# Patient Record
Sex: Male | Born: 1940
Health system: Southern US, Community
[De-identification: ages and names within clinical notes are randomized; demographics above are authoritative.]

## PROBLEM LIST (undated history)

## (undated) DIAGNOSIS — M545 Low back pain, unspecified: Secondary | ICD-10-CM

## (undated) DIAGNOSIS — D219 Benign neoplasm of connective and other soft tissue, unspecified: Secondary | ICD-10-CM

## (undated) DIAGNOSIS — M720 Palmar fascial fibromatosis [Dupuytren]: Secondary | ICD-10-CM

## (undated) DIAGNOSIS — R251 Tremor, unspecified: Secondary | ICD-10-CM

## (undated) DIAGNOSIS — I10 Essential (primary) hypertension: Secondary | ICD-10-CM

## (undated) DIAGNOSIS — E785 Hyperlipidemia, unspecified: Secondary | ICD-10-CM

## (undated) DIAGNOSIS — I219 Acute myocardial infarction, unspecified: Secondary | ICD-10-CM

## (undated) DIAGNOSIS — I491 Atrial premature depolarization: Secondary | ICD-10-CM

## (undated) DIAGNOSIS — K219 Gastro-esophageal reflux disease without esophagitis: Secondary | ICD-10-CM

## (undated) DIAGNOSIS — I452 Bifascicular block: Secondary | ICD-10-CM

## (undated) DIAGNOSIS — I2510 Atherosclerotic heart disease of native coronary artery without angina pectoris: Secondary | ICD-10-CM

## (undated) DIAGNOSIS — Z955 Presence of coronary angioplasty implant and graft: Secondary | ICD-10-CM

## (undated) DIAGNOSIS — M1990 Unspecified osteoarthritis, unspecified site: Secondary | ICD-10-CM

## (undated) HISTORY — DX: Low back pain: M54.5

## (undated) HISTORY — DX: Low back pain, unspecified: M54.50

## (undated) HISTORY — DX: Benign neoplasm of connective and other soft tissue, unspecified: D21.9

## (undated) HISTORY — DX: Hyperlipidemia, unspecified: E78.5

## (undated) HISTORY — DX: Acute myocardial infarction, unspecified: I21.9

## (undated) HISTORY — PX: CORONARY STENT PLACEMENT: SHX1402

## (undated) HISTORY — DX: Presence of coronary angioplasty implant and graft: Z95.5

## (undated) HISTORY — PX: OTHER SURGICAL HISTORY: SHX169

---

## 1997-08-10 ENCOUNTER — Other Ambulatory Visit: Admission: RE | Admit: 1997-08-10 | Discharge: 1997-08-10 | Payer: Self-pay | Admitting: *Deleted

## 1997-08-30 ENCOUNTER — Other Ambulatory Visit: Admission: RE | Admit: 1997-08-30 | Discharge: 1997-08-30 | Payer: Self-pay | Admitting: *Deleted

## 1998-12-10 ENCOUNTER — Encounter: Payer: Self-pay | Admitting: Orthopedic Surgery

## 1998-12-10 ENCOUNTER — Ambulatory Visit (HOSPITAL_COMMUNITY): Admission: RE | Admit: 1998-12-10 | Discharge: 1998-12-10 | Payer: Self-pay | Admitting: Orthopedic Surgery

## 2001-12-15 ENCOUNTER — Ambulatory Visit (HOSPITAL_COMMUNITY): Admission: RE | Admit: 2001-12-15 | Discharge: 2001-12-15 | Payer: Self-pay | Admitting: Gastroenterology

## 2003-11-16 ENCOUNTER — Ambulatory Visit (HOSPITAL_COMMUNITY): Admission: RE | Admit: 2003-11-16 | Discharge: 2003-11-16 | Payer: Self-pay | Admitting: Orthopedic Surgery

## 2003-11-16 ENCOUNTER — Encounter (INDEPENDENT_AMBULATORY_CARE_PROVIDER_SITE_OTHER): Payer: Self-pay | Admitting: *Deleted

## 2003-11-16 ENCOUNTER — Ambulatory Visit (HOSPITAL_BASED_OUTPATIENT_CLINIC_OR_DEPARTMENT_OTHER): Admission: RE | Admit: 2003-11-16 | Discharge: 2003-11-16 | Payer: Self-pay | Admitting: Orthopedic Surgery

## 2005-03-31 ENCOUNTER — Emergency Department (HOSPITAL_COMMUNITY): Admission: EM | Admit: 2005-03-31 | Discharge: 2005-03-31 | Payer: Self-pay | Admitting: Family Medicine

## 2006-01-27 ENCOUNTER — Emergency Department (HOSPITAL_COMMUNITY): Admission: EM | Admit: 2006-01-27 | Discharge: 2006-01-27 | Payer: Self-pay | Admitting: Family Medicine

## 2007-02-11 DIAGNOSIS — I219 Acute myocardial infarction, unspecified: Secondary | ICD-10-CM

## 2007-02-11 HISTORY — DX: Acute myocardial infarction, unspecified: I21.9

## 2007-02-11 HISTORY — PX: CORONARY ANGIOPLASTY: SHX604

## 2007-11-06 ENCOUNTER — Inpatient Hospital Stay (HOSPITAL_COMMUNITY): Admission: EM | Admit: 2007-11-06 | Discharge: 2007-11-10 | Payer: Self-pay | Admitting: Emergency Medicine

## 2007-12-01 ENCOUNTER — Inpatient Hospital Stay (HOSPITAL_COMMUNITY): Admission: EM | Admit: 2007-12-01 | Discharge: 2007-12-04 | Payer: Self-pay | Admitting: Emergency Medicine

## 2007-12-01 HISTORY — PX: CARDIAC CATHETERIZATION: SHX172

## 2007-12-16 ENCOUNTER — Encounter (HOSPITAL_COMMUNITY): Admission: RE | Admit: 2007-12-16 | Discharge: 2008-02-09 | Payer: Self-pay | Admitting: Cardiology

## 2007-12-24 ENCOUNTER — Ambulatory Visit: Payer: Self-pay | Admitting: *Deleted

## 2007-12-25 ENCOUNTER — Inpatient Hospital Stay (HOSPITAL_COMMUNITY): Admission: EM | Admit: 2007-12-25 | Discharge: 2007-12-27 | Payer: Self-pay | Admitting: Emergency Medicine

## 2008-02-11 ENCOUNTER — Encounter (HOSPITAL_COMMUNITY): Admission: RE | Admit: 2008-02-11 | Discharge: 2008-05-09 | Payer: Self-pay | Admitting: Cardiology

## 2008-03-16 ENCOUNTER — Inpatient Hospital Stay (HOSPITAL_COMMUNITY): Admission: AD | Admit: 2008-03-16 | Discharge: 2008-03-17 | Payer: Self-pay | Admitting: Cardiology

## 2008-05-10 ENCOUNTER — Encounter (HOSPITAL_COMMUNITY): Admission: RE | Admit: 2008-05-10 | Discharge: 2008-08-08 | Payer: Self-pay | Admitting: Cardiology

## 2008-08-17 ENCOUNTER — Inpatient Hospital Stay (HOSPITAL_COMMUNITY): Admission: EM | Admit: 2008-08-17 | Discharge: 2008-08-18 | Payer: Self-pay | Admitting: Emergency Medicine

## 2009-02-08 ENCOUNTER — Ambulatory Visit (HOSPITAL_BASED_OUTPATIENT_CLINIC_OR_DEPARTMENT_OTHER): Admission: RE | Admit: 2009-02-08 | Discharge: 2009-02-08 | Payer: Self-pay | Admitting: Orthopedic Surgery

## 2009-02-10 HISTORY — PX: CARDIOVASCULAR STRESS TEST: SHX262

## 2010-04-18 ENCOUNTER — Encounter (INDEPENDENT_AMBULATORY_CARE_PROVIDER_SITE_OTHER): Payer: Self-pay | Admitting: *Deleted

## 2010-04-23 NOTE — Letter (Signed)
Summary: Appointment - Reschedule  [facility], Main Office  [address]   [place], [place] [address]   Phone: [phone]  Fax: [phone]     April 18, 2010 MRN: [id number]   Sonya Oki [address] [place], [place]  [address]   Dear Mr. Grigoryan,   Due to a change in our office schedule, your appointment on  March 29,2012 at 10:00 must be changed.  It is very important that we reach you to reschedule this appointment. We look forward to participating in your health care needs. Please contact us at the number listed above at your earliest convenience to reschedule this appointment.     Sincerely, Pension scheme manager

## 2010-04-27 ENCOUNTER — Encounter: Payer: Self-pay | Admitting: Internal Medicine

## 2010-05-09 ENCOUNTER — Ambulatory Visit: Payer: Self-pay | Admitting: Internal Medicine

## 2010-05-13 LAB — BASIC METABOLIC PANEL
CO2: 28 mEq/L (ref 19–32)
Calcium: 9.1 mg/dL (ref 8.4–10.5)
Chloride: 101 mEq/L (ref 96–112)
Creatinine, Ser: 0.88 mg/dL (ref 0.4–1.5)
GFR calc Af Amer: >60 mL/min (ref >60)
Glucose, Bld: 90 mg/dL (ref 70–99)

## 2010-05-19 LAB — CARDIAC PANEL(CRET KIN+CKTOT+MB+TROPI)
CK, MB: 1.7 ng/mL (ref 0.3–4.0)
CK, MB: 1.9 ng/mL (ref 0.3–4.0)
Relative Index: INVALID (ref 0.0–2.5)
Total CK: 65 U/L (ref 7–232)
Troponin I: 0.02 ng/mL (ref 0.00–0.06)

## 2010-05-19 LAB — CBC
HCT: 41.8 % (ref 39.0–52.0)
MCHC: 34.5 g/dL (ref 30.0–36.0)
MCV: 96 fL (ref 78.0–100.0)
Platelets: 171 10*3/uL (ref 150–400)
RBC: 4.35 MIL/uL (ref 4.22–5.81)
WBC: 5.8 10*3/uL (ref 4.0–10.5)

## 2010-05-19 LAB — PROTIME-INR
INR: 1 (ref 0.00–1.49)
Prothrombin Time: 13.7 seconds (ref 11.6–15.2)

## 2010-05-19 LAB — COMPREHENSIVE METABOLIC PANEL
BUN: 14 mg/dL (ref 6–23)
CO2: 24 mEq/L (ref 19–32)
Calcium: 9.5 mg/dL (ref 8.4–10.5)
Chloride: 99 mEq/L (ref 96–112)
Creatinine, Ser: 0.9 mg/dL (ref 0.4–1.5)
GFR calc Af Amer: >60 mL/min (ref >60)
GFR calc non Af Amer: >60 mL/min (ref >60)
Total Bilirubin: 0.9 mg/dL (ref 0.3–1.2)

## 2010-05-19 LAB — DIFFERENTIAL
Basophils Absolute: 0 10*3/uL (ref 0.0–0.1)
Lymphocytes Relative: 17 % (ref 12–46)
Lymphs Abs: 1 10*3/uL (ref 0.7–4.0)
Neutro Abs: 4.3 10*3/uL (ref 1.7–7.7)

## 2010-05-19 LAB — POCT CARDIAC MARKERS: Troponin i, poc: <0.05 ng/mL (ref 0.00–0.09)

## 2010-05-19 LAB — CK TOTAL AND CKMB (NOT AT ARMC)
CK, MB: 2.3 ng/mL (ref 0.3–4.0)
Relative Index: INVALID (ref 0.0–2.5)
Total CK: 86 U/L (ref 7–232)

## 2010-05-28 LAB — CBC
HCT: 39.6 % (ref 39.0–52.0)
MCHC: 34.8 g/dL (ref 30.0–36.0)
MCV: 94.3 fL (ref 78.0–100.0)
Platelets: 173 10*3/uL (ref 150–400)
WBC: 5.9 10*3/uL (ref 4.0–10.5)

## 2010-05-28 LAB — BASIC METABOLIC PANEL
BUN: 13 mg/dL (ref 6–23)
CO2: 27 mEq/L (ref 19–32)
Chloride: 102 mEq/L (ref 96–112)
Potassium: 4.2 mEq/L (ref 3.5–5.1)

## 2010-06-01 IMAGING — CR DG CHEST 1V PORT
1 series · 1 of 1 positions shown · non-contrast
Comparison: 01/27/2006

CLINICAL DATA: Chest pain

CHEST - 1 VIEW portable

[AP]
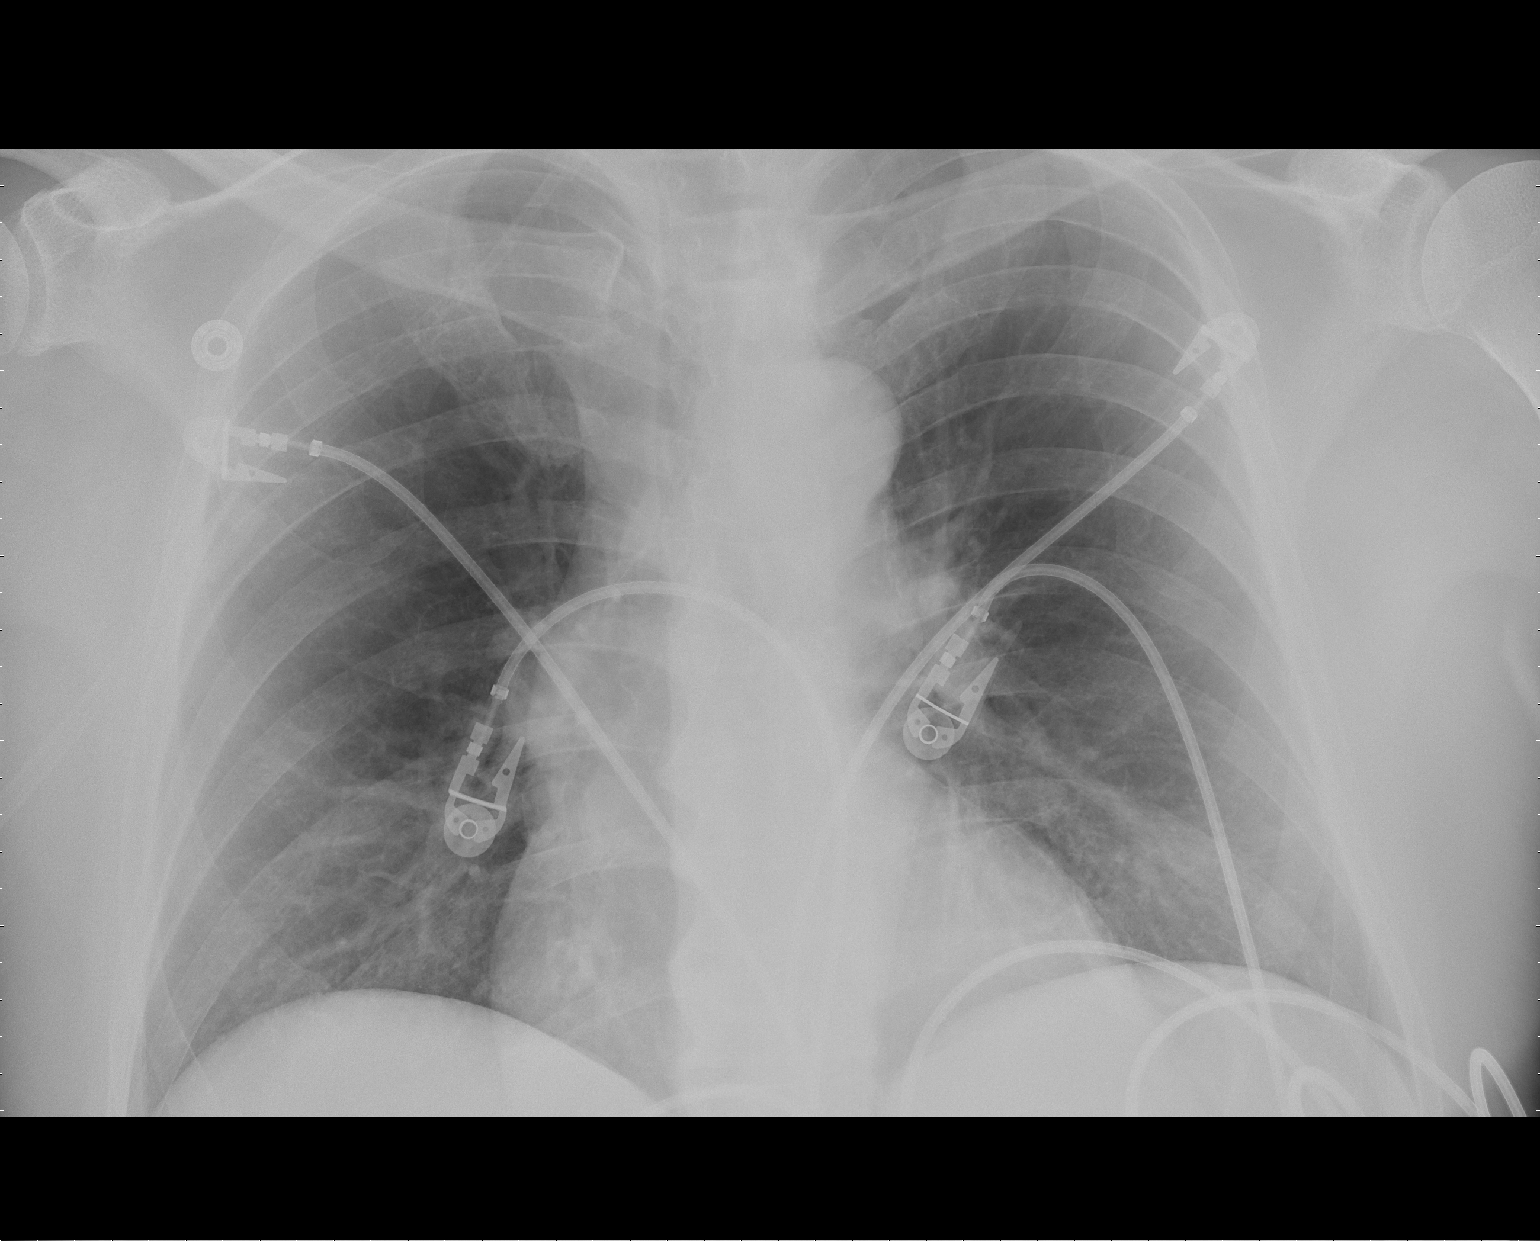

[1 of 1 positions shown; findings below may reference images not displayed]

FINDINGS: The heart size and mediastinal contours are within normal
limits.  Both lungs are clear.  The visualized skeletal structures
are unremarkable.
IMPRESSION: No active cardiopulmonary disease.

## 2010-06-25 NOTE — H&P (Signed)
NAME:  Zachary Barron, RABALAIS NO.:  [id number]   MEDICAL RECORD NO.:  [id number]          PATIENT TYPE:  INP   LOCATION:  2904                         FACILITY:  [facility]   PHYSICIAN:  Francisca December, M.D.  DATE OF BIRTH:  Jun 04, 1940   DATE OF ADMISSION:  11/06/2007  DATE OF DISCHARGE:                              HISTORY & PHYSICAL   CHIEF COMPLAINT:  Chest pain.   Zachary Barron is 70 year old male patient who complains of chest pain on  and off since Monday for this past week.  Apparently, he had picked up  his granddaughter, and he thought that this was related to  musculoskeletal pain.  He then began having chest pain that started last  night about 8 p.m.  The pain waxed and waned overnight, but never was  totally relieved.  He then went to [facility], and  EKG was concerning for acute myocardial infarction.  The patient had  minimal ST-segment elevation in the lateral leads, right bundle-branch  block, and rate 60 beats per minute.   In [facility] Room, he did rule in with troponins of 2.91.  He  was then brought to the cardiac catheterization lab emergently.   PAST MEDICAL HISTORY:  He has a history of pneumonia.  He has reflux.  He is otherwise healthy.  There is a family history of coronary artery  disease, 3 out of 6 brothers has had coronary artery disease.   SOCIAL HISTORY:  He lives in [place] with his wife.  His son is a  Armed forces operational officer.  The patient denies any tobacco, alcohol, or illicit drug  use.   FAMILY HISTORY:  Mother died in her 37s of hypertension and Parkinson  disease.  His father died at age 75 of old age.   ALLERGIES:  No known drug allergies.   MEDICATIONS:  He tells Dr. Amil Amen he is on Protonix 40 mg a day.   REVIEW OF SYSTEMS:  As above and otherwise negative.   PHYSICAL EXAMINATION:  VITAL SIGNS:  Temp 97.5, pulse is 72,  respirations 22, blood pressure 185/102, and O2 saturations 99% on room  air.  GENERAL:  He is anxious, but appears to be in no acute distress.  HEENT:  Grossly normal.  Sclerae clear.  Conjunctivae normal.  Nares  without drainage.  NECK:  No carotid or subclavian bruits.  No JVD or thyromegaly.  CHEST:  Clear to auscultation bilaterally.  No wheezing or rhonchi.  HEART:  Regular rate and rhythm.  No gross murmur.  ABDOMEN:  Good bowel sounds, nontender, and nondistended.  No masses, no  bruits.  LOWER EXTREMITIES:  No peripheral edema.  No femoral bruit.  SKIN:  Warm and dry.  NEUROLOGIC:  Cranial nerves II through XII grossly intact.  PSYCHIATRY:  Normal mood and affect, but with anxiety.   Chest x-ray:  N/A.   LABORATORY DATA:  Hemoglobin 16.3 and hematocrit 48.  Sodium 137,  potassium 4.5, BUN 13, creatinine 1.1, and glucose 111.  Point-of-care  markers, myoglobin 47.3/76.9, troponin 2.91.  EKG as stated above.   ASSESSMENT  AND PLAN:  1. Anterolateral wall myocardial infarction.  2. Right bundle-branch block.  3. Family history of coronary artery disease.  4. Gastroesophageal reflux disease.   The patient was brought to the cardiac catheterization lab emergently.  He did receive IV heparin in the emergency room, IV nitroglycerin as  well as 4 baby aspirins.      [name], P.A.      Francisca December, M.D.  Electronically Signed    LB/MEDQ  D:  11/06/2007  T:  11/07/2007  Job:  [id number]   cc:   Thora Lance, M.D.  Francisca December, M.D.

## 2010-06-25 NOTE — H&P (Signed)
NAME:  Zachary Barron, Zachary Barron NO.:  [id number]   MEDICAL RECORD NO.:  [id number]          PATIENT TYPE:  INP   LOCATION:  1830                         FACILITY:  [facility]   PHYSICIAN:  Jake Bathe, MD      DATE OF BIRTH:  Feb 01, 1941   DATE OF ADMISSION:  12/01/2007  DATE OF DISCHARGE:                              HISTORY & PHYSICAL   CHIEF COMPLAINT:  Chest pain.   HISTORY OF PRESENT ILLNESS:  The patient is a pleasant 70 year old  gentleman who suffered an anterior wall MI on November 06, 2007.  He  had an emergent cardiac catheterization were he was found to have two-  vessel disease.  He underwent drug-eluting stent implantation to the  LAD.  He has a known 80% proximal lesion in the circumflex, as well as a  tubular 75% mid lesion.  He was discharged from the hospital 3 days  later and was scheduled to come in approximately 1 month and to have the  circumflex worked on.  He had been doing fairly well at home until this  morning around 7:30.  At that time he began feeling discomfort in the  left upper chest area and in the left arm.  He also had discomfort in  the left leg.  The chest and arm discomfort was consistent with what he  had in the past with his MI.  It was a 3 on a scale of 1-10 and was not  associated with any shortness of breath, sweats or nausea.  He had just  walked about 20 feet prior to the episode.  He took three nitroglycerin  sublingual tablets to relieve the discomfort.  He was advised to come  into the office for follow-up where he states he continues to have some  slight discomfort in the left shoulder.  An EKG demonstrated Q-waves in  the anterior leads, consistent with what he had on his last hospital EKG  back in September.  EKG today, however, revealed deep T-wave inversions  in leads V1-V4.  There had been prior T-wave inversion in V4, but as  deep.  EKG does showed normal sinus rhythm with a right bundle-branch-  block which is not  new.   The patient forgot to take a dose enalapril, metoprolol and his Plavix  yesterday.  He has since taken as enalapril and metoprolol, but has not  had Plavix since November 29, 2007.  He was given a dose in the office  today.  Due to his symptoms and EKG changes, the decision was made to  admit to rule out for acute coronary syndrome.   CURRENT MEDICATIONS:  1. Plavix 75 mg a day.  2. Simvastatin 40 mg 1 tablet every evening once a day.  3. Aspirin 325 mg 1 tablet once a day.  4. Metoprolol tartrate 25 mg half a tablet twice a day.  5. Xanax 0.5 mg t.i.d. p.r.n.  6. Enalapril 2.5 mg 1 tablet p.o. twice daily.   PAST MEDICAL HISTORY:  1. Coronary artery disease, two-vessel, 99% mid lesion LAD, 80%  proximal lesion circumflex.  On cardiac catheterization, November 06, 2007, status post DES implantation of LAD diagonal.  PCI of      left circumflex pending.  2. Hypercholesterolemia.  3. Intermittent chronic low back pain.   DRUG ALLERGIES:  None known.   SURGICAL HISTORY:  1. Status post stent implantation, September, 2009 as above.  2. Fibroma, left foot  3. Dupuytren contracture of left hand repair, Katy Fitch. Sypher, M.D.      2005.   PAST MEDICAL HISTORY:  1. Two-vessel coronary artery disease, as above.  2. Hypercholesterolemia.  3. Intermittent low back pain.  4. Anxiety.   SOCIAL HISTORY:  Married.  His son is a Armed forces operational officer.  Daughter-in-law  is a family Conservation officer, nature.  Both reside in [place].  The  patient denies any tobacco, alcohol or illicit drug use.   FAMILY HISTORY:  Mother died in her 33s from hypertension and Parkinson  disease.  Father died at age 13 of old age.   ROS: unless stated above, all 12 ROS negative.   OBJECTIVE DATA:  VITALS:  Weight 190, BP 154/86, pulse sitting 60.  GENERAL:  A very pleasant gentleman, although somewhat anxious in no  acute distress.  SKIN:  Warm and dry.  HEENT:  Unremarkable.  NECK:  No JVD.  No  carotid bruits are auscultated.  No adenopathy.  CARDIOVASCULAR:  Normal S1 and S2 without murmur, rub, click or gallop.  LUNGS:  Good excursion, clear throughout.  CHEST WALL:  Nontender.  ABDOMEN:  Soft, nondistended.  Bowel sounds are present.  No masses or  hepatosplenomegaly.  EXTREMITIES:  Exam reveals no clubbing, cyanosis or edema, +2 distal  pulses intact.  NEURO:  Nonfocal.   EKG as stated in HPI. Personally viewed. Labs pending.   IMPRESSION:  1. Chest pain - unstable angina  2. Coronary artery disease, status post drug-eluting stent      implantation to the left anterior descending artery with residual      disease left in the circumflex.  3. Hypertension.  4. Hypercholesterolemia.   PLAN:  We will admit to step-down telemetry.  Make NPO for now.  Start  IV heparin per pharmacy protocol.  Check cardiac isoenzymes STAT.  Francisca December, M.D. and associates to follow.  If pain continues, or if  cardiac biomarkers elevated, proceed with urgent cardiac  catheterization.      Tamera C. Delorse Limber, MD  Electronically Signed    TCL/MEDQ  D:  12/01/2007  T:  12/01/2007  Job:  [id number]

## 2010-06-25 NOTE — H&P (Signed)
NAME:  Zachary, Barron NO.:  [id number]   MEDICAL RECORD NO.:  [id number]          PATIENT TYPE:  INP   LOCATION:  6523                         FACILITY:  [facility]   PHYSICIAN:  Unice Cobble, MD     DATE OF BIRTH:  09/07/1940   DATE OF ADMISSION:  12/25/2007  DATE OF DISCHARGE:                              HISTORY & PHYSICAL   PRIMARY CARDIOLOGIST:  Dr. Amil Amen of [facility].   CHIEF COMPLAINT:  Chest pain.   HISTORY OF PRESENT ILLNESS:  This is a 70 year old white male with a  history of hyperlipidemia and myocardial infarction, status post PCI x3,  who presents with angina.  The patient had onset of angina at rest  tonight at 21:30.  His chest pain was substernal in nature without  radiation and was characterized as severe.  He had mild shortness of  breath and heart racing, but no nausea, vomiting or diaphoresis.  The  patient took three sublingual nitroglycerin and a Xanax with mild  relief.  Here in the emergency department on a nitroglycerin drip, he  still has 1/10 chest pain at this time but is not uncomfortable.  He has  had no episodes of sustained chest pain requiring nitroglycerin since  discharge from the hospital in October when he received two stents to  his circumflex artery.  He has had several short episodes of chest pain  in the last few months which have resolved spontaneously.   PAST MEDICAL HISTORY:  1. Anterior MI in September 2009 with a drug-eluting stent placed to      the LAD.  He just had two drug-eluting stent placed to the      circumflex in October 2009.  His last catheterization report is      dated December 01, 2007, which showed proximal LAD of 60-70%, mid      LAD patent stent, 90-95% proximal circumflex which was stented,      nonsignificant lesions in the RCA, and 90% lesions in D1-D2, which      are both small vessels.  His ejection fraction was normal.  2. Hyperlipidemia.  3. Low back pain.  4. Fibroma left foot.  5. Status post repair of left Dupuytren contracture.  6. Anxiety.   ALLERGIES:  NO KNOWN DRUG ALLERGIES.   MEDICATIONS:  1. Plavix 75 mg daily.  2. Aspirin 325 mg daily.  3. Simvastatin 40 mg daily.  4. Metoprolol 12.5 mg b.i.d.  5. Enalapril 2.5 mg daily.  6. Xanax 0.5 mg t.i.d. p.r.n.   SOCIAL HISTORY:  He is married and lives with his wife.  There are two  physicians in the family living in [place], (dermatologist, family  physician).  No tobacco, alcohol or drugs.   FAMILY HISTORY:  His mother died in her 100s of hypertension and  Parkinson's disease.  His father died at the age of 14 of old age.   REVIEW OF SYSTEMS:  A complete review of systems was done and found to  be otherwise negative except as stated in the HPI.   PHYSICAL EXAMINATION:  VITAL SIGNS:  Temperature is afebrile with a  pulse of 65, respiratory rate is 20, blood pressure is 107/71 with 100%  on 2 liters.  GENERAL:  He is in no acute distress.  HEENT:  Normocephalic, atraumatic.  PERRLA, EOMI, MMM, good dentition.  Oropharynx is without erythema or exudates.  NECK:  Supple without lymphadenopathy, thyromegaly, bruits or  jugulovenous distention.  HEART:  Regular rate and rhythm with a normal S1-S2.  No murmurs,  gallops or rubs.  Normal PMI.  Pulses 2+ and equal bilaterally without  bruits.  LUNGS:  Clear to auscultation bilaterally.  ABDOMEN:  Soft and nontender with normal bowel sounds.  No rebound or  guarding.  No hepatosplenomegaly.  EXTREMITIES:  Show no cyanosis, clubbing or edema.  MUSCULOSKELETAL:  Shows no spine or CVA tenderness.  No joint  deformities or effusions.  NEUROLOGICAL:  He is alert and oriented x3  with cranial nerves II-XII grossly intact.  Strength is 5/5 in all  extremities and axial groups with normal sensation throughout.   LABORATORY DATA:  His laboratories are pending at this time with a known  negative troponin and normal CK-MB.  EKG shows a rate of 60 and normal   sinus rhythm with a right bundle branch block.  He has anterolateral T-  wave inversions and anteroseptal ST elevation of 0.5 mm.  He also has  inferior T-wave inversions.  There is a first-degree AV block and  anterior Q-waves.  This is unchanged from his EKG from October 2009.   ASSESSMENT/PLAN:  This is a 70 year old white male with known coronary  disease, status post percutaneous coronary intervention x3, who presents  with unstable angina.  I will admit him for rule out tonight.  His  nitroglycerin drip will be continued and a heparin drip will be started.  His home medications will also be continued.  Depending what his enzymes  show, he could be considered for a functional test if his troponin is  negative and possibly a cardiac catheterization to evaluate stent  patency.  I will make him n.p.o. just in case.  These will be done at  the attending's discretion.      Unice Cobble, MD  Electronically Signed     ACJ/MEDQ  D:  12/25/2007  T:  12/25/2007  Job:  [id number]

## 2010-06-25 NOTE — Discharge Summary (Signed)
NAME:  Zachary Barron, Zachary Barron NO.:  [id number]   MEDICAL RECORD NO.:  [id number]          PATIENT TYPE:  INP   LOCATION:  6523                         FACILITY:  [facility]   PHYSICIAN:  Corky Crafts, MDDATE OF BIRTH:  1940-03-25   DATE OF ADMISSION:  12/25/2007  DATE OF DISCHARGE:  12/27/2007                               DISCHARGE SUMMARY   DISCHARGE DIAGNOSES:  1. Chest pain, resolved.  2. Coronary artery disease, history of anterior myocardial infarction      in 2009 with a drug-eluting stent to the left anterior descending      as well as drug-eluting stent x2 to the circumflex.  3. Hyperlipidemia.  4. Anxiety.  5. Long-term medication use.   HOSPITAL COURSE:  [name] is a 70 year old male patient with known  coronary artery disease who was seen here in the hospital, December 25, 2007, with substernal chest pain.  His enzymes turned out to be negative  x5.  His cath films were reviewed from his previous catheterization that  showed a 50% and 60% ostial LAD lesion.  Dr. Eldridge Dace had a long  discussion with the patient and his wife regarding options for further  evaluation.  We discussed the exercise treadmill test.  An exercise  treadmill test was performed in hospital without problem.  This was  negative exercise treadmill test.  While the patient was in the  hospital, he remained without chest pain off heparin and nitroglycerin.  He was then felt to be ready for discharge to home with followup to Dr.  Amil Amen.   DISCHARGE MEDICATIONS:  Plavix 75 mg a day, enteric-coated aspirin 325  mg a day, simvastatin 40 mg a day, metoprolol 25 mg one tablet p.o.  b.i.d., enalapril 2.5 mg a day, Xanax p.r.n., sublingual nitroglycerin  p.r.n. chest pain.   We instructed the patient to see Dr. Valentina Lucks who may restart sertraline  as an outpatient.  He needs to follow with Dr. Valentina Lucks in 1 month.  Follow up with Dr. Amil Amen as previously scheduled.  Activities per  cardiac rehabilitation.  Increase activities slowly.  The patient is to  remain on low-sodium heart-healthy diet.      [name], P.A.      Corky Crafts, MD  Electronically Signed    LB/MEDQ  D:  02/28/2008  T:  02/29/2008  Job:  [id number]   cc:   Thora Lance, M.D.  Francisca December, M.D.

## 2010-06-25 NOTE — Discharge Summary (Signed)
NAME:  Zachary Barron, Zachary Barron NO.:  [id number]   MEDICAL RECORD NO.:  [id number]          PATIENT TYPE:  INP   LOCATION:  2013                         FACILITY:  [facility]   PHYSICIAN:  Francisca December, M.D.  DATE OF BIRTH:  1941/02/01   DATE OF ADMISSION:  12/01/2007  DATE OF DISCHARGE:  12/04/2007                               DISCHARGE SUMMARY   DISCHARGE DIAGNOSES:  1. Unstable angina pectoris, resolved.  2. Coronary artery disease, status post drug-eluting and bare-metal      stent placement to the left circumflex as described below.  3. Resolved left ventricular dysfunction, status post anterior      myocardial infarction, treated directly with percutaneous coronary      intervention/drug-eluting stent to the mid left anterior      descending.  4. Hypercholesterolemia.  5. Intermittent low back pain.   HOSPITAL COURSE:  Mr. Heal is a 70 year old male patient who suffered  anterior wall myocardial infarction on November 06, 2007.  He had an  emergent cardiac catheterization where he was found to have two-vessel  disease.  He underwent drug-eluting stent implantation to the LAD, but  had a known proximal lesion in the circumflex.  He was discharged from  the hospital from his anterior myocardial infarction and was scheduled  to come in approximately 1 month to have the circumflex worked on.  He  had been drug fairly well until the date of admission around 7:30.  At  that point, he began having discomfort in the left upper chest area and  in the left arm.  The discomfort was consistent with what he had in the  past with his myocardial infarction.  He was advised to come to the  office where he continued to have chest discomfort.  The EKG showed Q-  waves in the anterior leads consistent with what he had back in the  hospital in September; however, the EKG did show deep T-wave inversions  in leads V1 through V4, where there have been prior T-wave inversion in  V4,  but not as deep.  The EKG was in normal sinus rhythm with a right  bundle-branch block, which is not new.   Because of the patient's symptoms and known disease, he was admitted to  the hospital and ultimately he was taken to the cardiac catheterization  lab where he was found to have worsening of the proximal LAD lesion of  70% stenosis.  There was a widely patent drug-eluting stent in the mid  LAD.  There was a 90-95% stenosis in the circumflex distribution as  described above.   We had a long discussion with the patient.  Coronary artery bypass  grafting is a reasonable option, but Dr. Amil Amen was not convinced that  the proximal LAD lesion was significant.  The patient wished to avoid  surgery if at all possible.  Dr. Amil Amen then suggested to return to the  Cath Lab to have a PressureWire/IVUS.  At the location of the LAD lesion  if it was significant, then he would recommend bypass; if not, then he  would  go ahead and proceed with a drug-eluting stent to the circumflex.  The patient agreed with this.   Ultimately after the procedure, the patient did get stent placement,  drug eluting to the mid and distal circumflex.  The patient remained in  the hospital until December 04, 2007, and was ready for discharge to home  at that point.   LABORATORY STUDIES:  Hemoglobin 13.1, hematocrit 38.9, white count 8.7,  and platelets 166.  His cardiac panel was negative.  Sodium 136,  potassium 4.2, BUN 14, creatinine 1.00, and magnesium 2.1.   DISCHARGE MEDICATIONS:  As per medication reconciliation sheet.  1. Plavix 75 mg a day.  2. Simvastatin 40 mg nightly.  3. Aspirin 325 mg a day.  4. Metoprolol 12.5 mg p.o. b.i.d.  5. Xanax 0.5 mg t.i.d. p.r.n.  6. Enalapril 2.5 mg twice a day.   The patient is to remain a low-sodium heart-healthy diet.  Clean cath  site gently with soap and water.  No scrubbing.  Follow up with Dr.  Amil Amen in 2 weeks, sooner if needed.       [name],  P.A.      Francisca December, M.D.  Electronically Signed    LB/MEDQ  D:  02/02/2008  T:  02/03/2008  Job:  [id number]

## 2010-06-25 NOTE — Cardiovascular Report (Signed)
NAME:  Zachary Barron, Zachary Barron NO.:  [id number]   MEDICAL RECORD NO.:  [id number]          PATIENT TYPE:  INP   LOCATION:  2920                         FACILITY:  [facility]   PHYSICIAN:  Jake Bathe, MD      DATE OF BIRTH:  25-Aug-1940   DATE OF PROCEDURE:  DATE OF DISCHARGE:                            CARDIAC CATHETERIZATION   PROCEDURES:  1. Left heart catheterization  2. Selective coronary angiography.  3. Left ventriculogram.   INDICATIONS:  A 70 year old male with recent anterior ST-elevation MI  with new-onset chest pain, beginning at 7 o'clock this morning, somewhat  relieved with nitroglycerin with persistent T-wave inversion in the  precordial leads with subtle ST elevations in V2, V3 with ongoing chest  pain despite nitroglycerin and heparin therapy.   PROCEDURE DETAILS:  Informed consent was obtained, risk of stroke, heart  attack, death, bleeding, renal impairment were discussed with the  patient at length.  The patient was prepped and draped and placed on a  sterile field on the catheterization table and 1% lidocaine was used for  local anesthesia.  Right groin was utilized.  After visualizing the  femoral head under fluoroscopy, a 6-French sheath was placed into the  right femoral artery.  A Judkins left #4 catheter was then used to  selectively cannulate the left main artery.  Multiple views with hand  injection of Omnipaque were obtained.  This catheter was then removed  and a Judkins left #4 catheter was used to selectively cannulate the  right coronary artery.  Multiple views with hand injection of Omnipaque  were obtained.  This catheter was removed and angled pigtail was used  cross the aortic valve into the left ventricle, and the left  ventriculogram was performed in the RAO position with power injection of  30 mL of contrast.  Following the procedure, the sheaths were removed  and manual compression was held at groin site.  The patient was  hemodynamically stable.   FINDINGS:  1. Left main artery - short, calcified, no angiographically      significant coronary artery disease.  In the LAO projections, the      calcification of the left main is present.  2. Left anterior descending artery - previously placed drug-eluting      stent is widely patent in the mid segment.  This artery has 2      diagonal branches, both of which are small to moderate size in      caliber and had 90% ostial lesions.  The proximal LAD has a tubular      60-70% narrowing with calcification noted.  The overall flow down      the LAD is somewhat sluggish with TIMI 2 flow.  The LAD then      continues to wrap around the apex.  3. Circumflex artery - the previously described lesions are stable.      There are 2 large obtuse marginal branches, and just prior to the      first obtuse marginal branch, there is a 99% stenosis.  There is  another sequential 70-80% stenosis, just prior to the second obtuse      marginal branch.  These are large caliber vessels.  4. Right coronary artery - there is mild diffuse disease throughout      the right coronary artery system, up to 30% in the proximal      segment.  This is the dominant artery giving rise to the PDA.   NOTE:  The initial injections were performed while the patient was on  nitroglycerin drip.  This was discontinued secondary to decreasing blood  pressure during case.   Left ventriculogram - left ventricular ejection fraction is 60-65% with  no obvious wall motion abnormalities.  This is improved from prior  catheterization where there was distal inferior and anterior wall  hypokinesis.   HEMODYNAMICS:  Left ventricular systolic pressure is 109 and aortic  pressure is 109/66 with a mean of 83.  Left ventricular end-diastolic  pressure was 10.   IMPRESSION:  1. Worsening proximal left anterior descending lesion up to 60-70%      stenosis.  Widely patent drug-eluting stent in mid left  anterior      descending.  A 90-95% stenosis in the circumflex distribution as      described above.  2. Normal left ventricular ejection fraction, improved from last      catheterization.  No evidence of aortic stenosis.   PLAN:  Findings were discussed with Dr. Katrinka Blazing at the time of  catheterization and then subsequently with Dr. Amil Amen.  Dr. Amil Amen  will review cardiac catheterization to make decision on PCI versus  bypass.  For now, the patient will be placed in the CCU for further  observation.  Findings were discussed with the patient and family.      Jake Bathe, MD  Electronically Signed     MCS/MEDQ  D:  12/01/2007  T:  12/02/2007  Job:  [id number]   cc:   Francisca December, M.D.

## 2010-06-27 ENCOUNTER — Ambulatory Visit (INDEPENDENT_AMBULATORY_CARE_PROVIDER_SITE_OTHER): Payer: Medicare Other | Admitting: Internal Medicine

## 2010-06-27 ENCOUNTER — Encounter: Payer: Self-pay | Admitting: Internal Medicine

## 2010-06-27 VITALS — BP 131/72 | HR 55 | Ht 71.0 in | Wt 193.0 lb

## 2010-06-27 DIAGNOSIS — I2510 Atherosclerotic heart disease of native coronary artery without angina pectoris: Secondary | ICD-10-CM

## 2010-06-27 DIAGNOSIS — E785 Hyperlipidemia, unspecified: Secondary | ICD-10-CM

## 2010-06-27 NOTE — Assessment & Plan Note (Signed)
Lipids look great. Continue statin.

## 2010-06-27 NOTE — Assessment & Plan Note (Signed)
Doing very well. No evidence of angina. Very tuned in to need for aggressive RF management. Will have him f/u with Dr. Excell Seltzer for ongoing care of his CAD.

## 2010-06-27 NOTE — Progress Notes (Signed)
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 Previously followed by Dr. Amil Amen and subsequently followed by Dr. Chales Abrahams at [facility]. Referred here to establish on-going care.  In Sept 2009 had Anterior MI. Cath with 99% LAD and 80% pLCX. Drug eluting stent to LAD. Unable to stent the LCX. In 10/09 brought back and able to stent the LCX with the LCX with 2 stents. Had several episodes of pain and anxiety and ended up calling 911. In 2/10 had BMS to pLAD. In 7/10 had CP and had a stress test. Exercised for 10:00 no CP but had evidence of "moderate to severe apical ischemia." Dr. Amil Amen recommended re-look cath. He got 2nd opinion from [facility] who advised medical treatment and also treated his anxiety. Has not had a cath since that time.   In 9/11 had severe CP in [place] at very stressful meeting. Ruled out MI. Initially planned cath but then decided to treat medically. Had Lexiscan Myoview at [facility] 10/18/09. EF 67% small distal anteroseptal and apical infarct. No ischemia.  Now doing very well. Focusing on stress reduction. Exercising 14min/day. No CP since [facility].  Eating well.   Most recent lipids 10/04/09: TC 137 TG 85 HDL 62 LDL 52   ROS: All other systems normal except as mentioned in HPI, past medical history and problem list.    Past Medical History  Diagnosis Date  . MI (myocardial infarction)   . Stented coronary artery   . Hyperlipemia   . LBP (low back pain)   . Fibroma   . Anxiety     Current Outpatient Prescriptions  Medication Sig Dispense Refill  . aspirin 81 MG tablet Take 81 mg by mouth daily.        . clopidogrel (PLAVIX) 75 MG tablet Take 75 mg by mouth daily.        . diclofenac sodium (VOLTAREN) 1 % GEL Apply topically. Apply every other day       . enalapril (VASOTEC) 2.5 MG tablet Take 2.5 mg by mouth daily.        . Multiple Vitamin (MULTIVITAMIN) tablet Take 1 tablet by mouth daily.        .  nitroGLYCERIN (NITROSTAT) 0.4 MG SL tablet Place 0.4 mg under the tongue every 5 (five) minutes as needed.        . Nutritional Supplements (VITAMIN D MAINTENANCE PO) Take by mouth. Vitamin D2 1 capsule daily       . sertraline (ZOLOFT) 25 MG tablet Take 25 mg by mouth daily.        . simvastatin (ZOCOR) 40 MG tablet Take 40 mg by mouth at bedtime.           No Known Allergies  History   Social History  . Marital Status: Married    Spouse Name: N/A    Number of Children: N/A  . Years of Education: N/A   Occupational History  . Not on file.   Social History Main Topics  . Smoking status: Former Smoker    Quit date: 06/11/1975  . Smokeless tobacco: Not on file  . Alcohol Use: No  . Drug Use: No  . Sexually Active: Not on file   Other Topics Concern  . Not on file   Social History Narrative  . No narrative on file    Family History  Problem Relation Age of Onset  . Hypertension Mother   . Parkinsonism Other  PHYSICAL EXAM: Filed Vitals:   06/27/10 1151  BP: 131/72  Pulse: 55   General:  Well appearing. No respiratory difficulty HEENT: normal Neck: supple. no JVD. Carotids 2+ bilat; no bruits. No lymphadenopathy or thryomegaly appreciated. Cor: PMI nondisplaced. Regular rate & rhythm. No rubs, gallops or murmurs. Lungs: clear Abdomen: soft, nontender, nondistended. No hepatosplenomegaly. No bruits or masses. Good bowel sounds. Extremities: no cyanosis, clubbing, rash, edema Neuro: alert & oriented x 3, cranial nerves grossly intact. moves all 4 extremities w/o difficulty. Affect pleasant.  ECG: Sinus bradycardia 52 iRBBB. poor R wave progression. No ST-T wave abnormalities.    ASSESSMENT & PLAN:

## 2010-06-27 NOTE — Patient Instructions (Signed)
Your physician recommends that you schedule a follow-up appointment in: 12 months with Dr Cooper.  

## 2010-06-28 NOTE — Op Note (Signed)
   NAME:  Zachary Barron, Zachary Barron NO.:  [id number]   MEDICAL RECORD NO.:  [id number]                    PATIENT TYPE:   LOCATION:                                       FACILITY:   PHYSICIAN:  Danise Edge, M.D.                DATE OF BIRTH:   DATE OF PROCEDURE:  12/15/2001  DATE OF DISCHARGE:                                 OPERATIVE REPORT   PROCEDURE:  Screening colonoscopy.   INDICATIONS:  The patient is a 70 year old male born February 20, 2040.  The patient  is scheduled to undergo his first screening colonoscopy with polypectomy to  prevent colon cancer.   ENDOSCOPIST:  Danise Edge, M.D.   PREMEDICATION:  Versed 7.5 mg, Demerol 50 mg.   ENDOSCOPE:  Olympus pediatric colonoscope.   DESCRIPTION OF PROCEDURE:  After obtaining informed consent, the patient was  placed in the left lateral decubitus position.  I administered intravenous  Demerol and intravenous Versed to achieve conscious sedation for the  procedure.  The patient's blood pressure, oxygen saturation, and cardiac  rhythm were monitored throughout the procedure and documented in the medical  record.   Anal inspection was normal.  Digital rectal exam revealed a non-nodular  prostate.  The Olympus pediatric video colonoscope was introduced into the  rectum and advanced to the cecum.  Colonic preparation for the exam today  was excellent.   Rectum normal.   Sigmoid colon and descending colon normal.   Splenic flexure normal.   Transverse colon normal.   Hepatic flexure normal.   Ascending colon normal.   Cecum and ileocecal valve normal.    ASSESSMENT:  Normal screening proctocolonoscopy to the cecum.  No endoscopic  evidence for the presence of colorectal neoplasia.                                               Danise Edge, M.D.    MJ/MEDQ  D:  12/15/2001  T:  12/15/2001  Job:  [id number]   cc:   Thora Lance, M.D.  [address]  [place]  [place] [address]  Fax:  [phone]

## 2010-06-28 NOTE — Discharge Summary (Signed)
NAME:  Zachary Barron, Zachary Barron NO.:  [id number]   MEDICAL RECORD NO.:  [id number]          PATIENT TYPE:  INP   LOCATION:  3702                         FACILITY:  [facility]   PHYSICIAN:  Francisca December, M.D.  DATE OF BIRTH:  [birth year]   DATE OF ADMISSION:  11/06/2007  DATE OF DISCHARGE:  11/10/2007                               DISCHARGE SUMMARY   DISCHARGE DIAGNOSES:  1. Acute anterior myocardial infarction with drug-eluting stent to the      left anterior descending.  2. Residual circumflex disease.  3. Dyslipidemia, treated.  4. Hypertension.  5. Ischemic cardiomyopathy, ejection fraction 40%.  6. Right bundle-branch block.  7. Family history of coronary artery disease.   HOSPITAL COURSE:  Zachary Barron is a 70 year old male patient who had  chest pain on and off since Monday of this week.  This chest pain then  started again left night about 8 o'clock and waxed and waned since that  time.  He never has been really pain free.  He presented to the  emergency room and his EKG showed right bundle-branch block, pulse 60  with ST-segment elevations in V4-V6.   He was taken emergently to the Cardiac Catheterization Suite where he  was found to have an occluded mid LAD lesion.  Drug-eluting stents were  implanted to the mid LAD lesion without difficulty.  The patient did  have residual circumflex disease, which will need to be intervened on in  the future, but we felt this was obviously the culprit lesion.   The patient did have some pain on and off after the cardiac  catheterization,and I have performed a modified Bruce protocol on  patient.  The patient was without symptoms and had no chest pain.  We  felt he was ready for discharge to home.   LAB STUDIES:  During his hospitalization include troponin of 20.50,  hemoglobin 12.8, hematocrit 37.3, white count 7.6, and platelets 203.  Total cholesterol 140, triglycerides 58, HDL 52, and LDL 76.  Sodium  137, potassium 4.2,  BUN 9, and creatinine 0.85.   DISCHARGE MEDICATIONS:  1. Aspirin 325 mg 1 tablet daily.  2. Simvastatin 40 mg once a day.  3. Metoprolol 25 mg one-half tablet p.o. b.i.d.  4. Enalapril 2.5 mg twice a day.  5. Plavix 75 mg once a day.  6. Xanax 0.5 mg 1 tablet 2-3 times a day as needed for stress/anxiety.  7. Sublingual nitroglycerin p.r.n. chest pain.   The patient is to remain on a low-sodium, heart-healthy diet.  Activity  per cardiac rehabilitation.  Follow up with Dr. Amil Amen on December 16, 2007, at 9:30 a.m.  Follow up with Memory Dance, nurse practitioner on  November 24, 2007, at 10:30 a.m.      [name], P.A.      Francisca December, M.D.  Electronically Signed    LB/MEDQ  D:  01/07/2008  T:  01/08/2008  Job:  [id number]

## 2010-06-28 NOTE — Op Note (Signed)
NAME:  Zachary Barron, Zachary Barron               ACCOUNT NO.:  [id number]   MEDICAL RECORD NO.:  [id number]          PATIENT TYPE:  AMB   LOCATION:  DSC                          FACILITY:  [facility]   PHYSICIAN:  Zachary Barron., M.D.DATE OF BIRTH:  07-17-1940   DATE OF PROCEDURE:  11/16/2003  DATE OF DISCHARGE:                                 OPERATIVE REPORT   PREOPERATIVE DIAGNOSIS:  Complex Dupuytren's contracture affecting left hand  involving thumb-index web space, pretendinous fibers to index, long, ring  and small fingers in the palm, and extensive involvement of the small finger  with large nodules over the P1 and P2 segments with a significant small  finger metacarpophalangeal flexion contracture of nearly 60 degrees.   POSTOPERATIVE DIAGNOSIS:  Complex Dupuytren's contracture affecting left  hand involving thumb-index web space, pretendinous fibers to index, long,  ring and small fingers in the palm, and extensive involvement of the small  finger with large nodules over the P1 and P2 segments with a significant  small finger metacarpophalangeal flexion contracture of nearly 60 degrees.   OPERATION:  1.  Excision of complex Dupuytren's contracture from left palm involving 3      incisions with removal of pretendinous fibers to index, long and ring      fingers, and excision of the natatory ligaments at the thumb-index web      space.  2.  Excision of Dupuytren's contracture of left small finger with V-Y      advancement flaps for skin advancement and transposition to lengthen the      palmar skin of the small finger.   OPERATING SURGEON:  Zachary Fitch. Sypher, M.D.   ASSISTANT:  Zachary Barron, P.A.   ANESTHESIA:  General by LMA.   SUPERVISING ANESTHESIOLOGIST:  Zachary Barron, M.D.   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   He is referred by his primary care physician, Dr.  Thora Barron, for  evaluation and management and was advised to consider resection surgery on  the left hand.   He has had 2 thorough informed consents, initially at the time of his  original consultation and at a second consultation, at which time we advised  him of the potential benefits of surgery including relief of his nodular  fibromatosis as well as release of his flexion contractures, as well as  potential risks including neurovascular injury, infection, severe scarring  and the development of reflex sympathetic dystrophy.   After questions were invited and exhaustibly answered, he is brought to the  operating room at this time for left hand surgery.   PROCEDURE:  Zachary Barron is brought to the operating room and placed in a  supine position upon on the operating table.  Following anesthesia  consultation with Dr. [name], general anesthesia by LMA technique was  selected.   After induction of general anesthesia, 1 g of Ancef was administered as an  IV prophylactic antibiotic.   The left arm was then prepped with Betadine soaping solution and sterilely  draped.  A pneumatic tourniquet was applied to the proximal brachium.  Following exsanguination of the left arm with an Esmarch bandage, the  arterial tourniquet was inflated to 250 mmHg.  Procedure commenced with  planning Brunner zig-zag incisions the length of the small finger and  multiple Brunner incisions across the palm to facilitate resection of the  pretendinous fibers to the index, long and ring fingers with limited skin  incisions.   The skin incisions were taken sharply and the fascia was undermined at the  dermal level.  An extensive dissection removed the pretendinous fibers to  the index, long and ring fingers, as well as the natatory ligaments between  the index and long fingers, taking care to carefully spare the common  digital arteries and nerves and the proper digital artery on the radial  aspect of  the index finger.  There were extensive nodules deep to the palmar  fascia and involvement of the intermetacarpal septae.   After excision of the pathologic fascia, we were able to fully extend the MP  joints of the index, long and ring fingers, as well as document complete  release of the thumb-index web space contracture.   Attention was then directed to the small finger, where a very complex  dissection of multiple lateral sheath nodules, a pair of spiral bands and  extensive central cord involvement across the PIP joint was accomplished.   The radial and ulnar neurovascular bundles were carefully protected  throughout dissection.  Several cutaneous branches over the P1 segment were  sacrificed due to the extensive involvement at this level.  We removed all  the pathologic fascia from the lateral fascial sheath to the finger and  relieved the flexion contracture of the MP and PIP joints completely.   After completion of the dissection, the wounds were irrigated thoroughly and  bleeding points electrocauterized with bipolar current, followed by repair  of the skin with mattress suture of 5-0 nylon.   The tourniquet was released and compression was held on the palm and the  wounds for approximately 15 minutes to prevent formation of a hematoma.   The wounds were then dressed with Silvadene, non-adherent Xeroflo gauze,  sterile Kerlix, acrylic fluff and a volar plaster splint, maintaining the  ring and small fingers in full extension.  There were no apparent  complications.   [name] tolerated surgery and anesthesia well.  He was transferred to  the recovery room with stable vital signs.       RVS/MEDQ  D:  11/16/2003  T:  11/16/2003  Job:  [id number]

## 2010-07-02 ENCOUNTER — Ambulatory Visit: Payer: Self-pay | Admitting: Internal Medicine

## 2010-07-02 ENCOUNTER — Encounter: Payer: Self-pay | Admitting: Internal Medicine

## 2010-09-03 ENCOUNTER — Encounter: Payer: Self-pay | Admitting: Cardiovascular Disease

## 2010-09-03 ENCOUNTER — Ambulatory Visit (INDEPENDENT_AMBULATORY_CARE_PROVIDER_SITE_OTHER): Payer: Medicare Other | Admitting: Cardiovascular Disease

## 2010-09-03 DIAGNOSIS — E7800 Pure hypercholesterolemia, unspecified: Secondary | ICD-10-CM

## 2010-09-03 DIAGNOSIS — I2510 Atherosclerotic heart disease of native coronary artery without angina pectoris: Secondary | ICD-10-CM

## 2010-09-03 DIAGNOSIS — E785 Hyperlipidemia, unspecified: Secondary | ICD-10-CM

## 2010-09-03 NOTE — Progress Notes (Signed)
HPI:  The This is a 70 year old gentleman presenting for evaluation of coronary artery disease. The patient initially presented with an anterior MI with occlusion of the mid LAD in 2009. I have reviewed the cardiac catheter report from his initial procedure and is a little bit confusing, but the best I can tell he had a Taxus liberte DES placed in the LAD and a micro-driver bare-metal stent placement diagonal. He underwent staged intervention of left circumflex a proximally one month later when he was treated with Promus drug-eluting stents in the mid and distal vessel. He developed recurrent angina in 2010 and repeat catheterization demonstrated progressive, severe proximal LAD stenosis. The patient was treated with a large bare metal stent in the proximal LAD. A MultiLink vision stent was utilized.  The patient has been followed by Dr. Allena Katz at [facility]. He was recently seen by Dr. Gala Romney in our practice and referred to me for ongoing care. The patient's preference is to continue a combined approach between our practice locally in [place] and with Dr. Allena Katz at [facility]. I told him that I'm fine with this approach and we can try to schedule him so that his office visits alternate between our practice and [facility].  From a symptomatic standpoint, the patient is doing very well at present. He has been managed medically over the past few years and has had no recurrent ischemic events. He has very mild chest pressure with heavy exertion, but this really has been minimal. The patient is able to work out for 45 minutes to one hour nearly every day without exertional symptoms. He specifically denies chest pain or pressure. He denies dyspnea, edema, palpitations, orthopnea, or PND. Stress reduction is played a major role in his treatment.  Outpatient Encounter Prescriptions as of 09/03/2010  Medication Sig Dispense Refill  . aspirin 81 MG tablet Take 81 mg by mouth daily.        . clopidogrel (PLAVIX) 75 MG  tablet Take 75 mg by mouth daily.        . diclofenac sodium (VOLTAREN) 1 % GEL Apply topically. Apply every other day       . enalapril (VASOTEC) 2.5 MG tablet Take 2.5 mg by mouth daily.        . Multiple Vitamin (MULTIVITAMIN) tablet Take 1 tablet by mouth daily.        . nitroGLYCERIN (NITROSTAT) 0.4 MG SL tablet Place 0.4 mg under the tongue every 5 (five) minutes as needed.        . Nutritional Supplements (VITAMIN D MAINTENANCE PO) Take by mouth. Vitamin D2 1 capsule daily       . sertraline (ZOLOFT) 25 MG tablet Take 25 mg by mouth daily.        . simvastatin (ZOCOR) 40 MG tablet Take 40 mg by mouth at bedtime.          No Known Allergies  Past Medical History  Diagnosis Date  . MI (myocardial infarction)   . Stented coronary artery   . Hyperlipemia   . LBP (low back pain)   . Fibroma   . Anxiety     ROS: Negative except as per HPI  BP 130/68  Pulse 59  Ht 5\' 11"  (1.803 m)  Wt 189 lb 1.9 oz (85.784 kg)  BMI 26.38 kg/m2  PHYSICAL EXAM: Pt is alert and oriented, NAD HEENT: normal Neck: JVP - normal, carotids 2+= without bruits Lungs: CTA bilaterally CV: RRR without murmur or gallop Abd: soft, NT, Positive BS,  no hepatomegaly Ext: no C/C/E, distal pulses intact and equal Skin: warm/dry no rash  ASSESSMENT AND PLAN:

## 2010-09-03 NOTE — Assessment & Plan Note (Signed)
Patient tolerating simvastatin. Will check lipids and LFTs and notify patient of results. Goal LDL is less than 70. The patient continues to work on diet and exercise.

## 2010-09-03 NOTE — Patient Instructions (Addendum)
Your physician wants you to follow-up in: February 2013 with Dr.Cooper.  You will receive a reminder letter in the mail two months in advance. If you don't receive a letter, please call our office to schedule the follow-up appointment.  Your physician recommends that you return for fasting lab work next week--lipid,liver,bmp

## 2010-09-03 NOTE — Assessment & Plan Note (Addendum)
The patient is stable without angina.  We will schedule him to come back and February following his next visit with Dr. Allena Katz. After that I be inclined to see him once a year and to alternate our visits with those of Dr. Allena Katz so that he is seen by a cardiologist about twice per year. We had a long discussion regarding dual antiplatelet therapy. The patient has both first and second generation drug eluting stents as well as low bleeding risk. I would be inclined to keep him on aspirin and Plavix, but if he requires short-term interruption of therapy the risk/benefit of Plavix interruption would have to be considered. Otherwise he will continue his current medical program. He is intolerant of beta blockers because of fatigue and bradycardia. He is tolerating simvastatin and tells me that he is due to have lipids checked. Will order lipids and LFTs.  With his extensive CAD, I would consider in exercise treadmill study sometime in the next 12 months.

## 2010-09-10 ENCOUNTER — Other Ambulatory Visit: Payer: Medicare Other | Admitting: *Deleted

## 2010-09-11 ENCOUNTER — Other Ambulatory Visit (INDEPENDENT_AMBULATORY_CARE_PROVIDER_SITE_OTHER): Payer: Medicare Other | Admitting: *Deleted

## 2010-09-11 DIAGNOSIS — E7800 Pure hypercholesterolemia, unspecified: Secondary | ICD-10-CM

## 2010-09-11 DIAGNOSIS — I2510 Atherosclerotic heart disease of native coronary artery without angina pectoris: Secondary | ICD-10-CM

## 2010-09-11 LAB — LIPID PANEL
HDL: 79.8 mg/dL (ref >39.00)
Triglycerides: 52 mg/dL (ref 0.0–149.0)

## 2010-09-11 LAB — BASIC METABOLIC PANEL
CO2: 27 mEq/L (ref 19–32)
Calcium: 8.9 mg/dL (ref 8.4–10.5)
Creatinine, Ser: 1 mg/dL (ref 0.4–1.5)
GFR: 78.75 mL/min (ref >60.00)
Glucose, Bld: 89 mg/dL (ref 70–99)

## 2010-09-12 LAB — HEPATIC FUNCTION PANEL
Albumin: 4.4 g/dL (ref 3.5–5.2)
Bilirubin, Direct: 0 mg/dL (ref 0.0–0.3)
Total Protein: 7 g/dL (ref 6.0–8.3)

## 2010-11-11 LAB — CBC
HCT: 37.3 — ABNORMAL LOW
HCT: 39.9
MCHC: 34.3
MCHC: 34.4
MCV: 94.2
MCV: 94.2
Platelets: 182
Platelets: 203
RBC: 3.96 — ABNORMAL LOW
WBC: 10.9 — ABNORMAL HIGH
WBC: 7.6

## 2010-11-11 LAB — BASIC METABOLIC PANEL
BUN: 9
CO2: 24
Chloride: 104
Creatinine, Ser: 0.85
Glucose, Bld: 107 — ABNORMAL HIGH
Potassium: 4.2

## 2010-11-11 LAB — LIPID PANEL
Cholesterol: 140
Triglycerides: 58

## 2010-11-11 LAB — POCT I-STAT, CHEM 8
BUN: 13
Calcium, Ion: 1.17
Creatinine, Ser: 1.1
TCO2: 28

## 2010-11-11 LAB — PLATELET COUNT: Platelets: 198

## 2010-11-11 LAB — CARDIAC PANEL(CRET KIN+CKTOT+MB+TROPI)
CK, MB: 27.8 — ABNORMAL HIGH
Relative Index: 11.4 — ABNORMAL HIGH
Relative Index: 5.8 — ABNORMAL HIGH
Troponin I: 20.5

## 2010-11-11 LAB — POCT CARDIAC MARKERS: Myoglobin, poc: 427

## 2010-11-12 LAB — PROTIME-INR
INR: 1
INR: 1.3
Prothrombin Time: 13.3
Prothrombin Time: 16.2 — ABNORMAL HIGH

## 2010-11-12 LAB — BASIC METABOLIC PANEL
BUN: 14
BUN: 15
CO2: 27
Calcium: 9.8
Calcium: 8.7
Chloride: 103
Chloride: 103
Chloride: 106
Creatinine, Ser: 1
Creatinine, Ser: 0.83
GFR calc Af Amer: >60
GFR calc Af Amer: >60
GFR calc Af Amer: >60
GFR calc non Af Amer: >60
GFR calc non Af Amer: >60
GFR calc non Af Amer: >60
Glucose, Bld: 102 — ABNORMAL HIGH
Potassium: 4.2
Potassium: 4.2
Sodium: 136
Sodium: 137

## 2010-11-12 LAB — DIFFERENTIAL
Basophils Absolute: 0
Basophils Relative: 0
Eosinophils Absolute: 0.1
Eosinophils Relative: 1
Lymphocytes Relative: 19
Monocytes Absolute: 0.6
Monocytes Relative: 11
Neutro Abs: 4.5
Neutrophils Relative %: 70

## 2010-11-12 LAB — CBC
HCT: 36.4 — ABNORMAL LOW
HCT: 38 — ABNORMAL LOW
HCT: 46.5
HCT: 36 — ABNORMAL LOW
HCT: 37.1 — ABNORMAL LOW
HCT: 38.5 — ABNORMAL LOW
Hemoglobin: 12.2 — ABNORMAL LOW
Hemoglobin: 12.5 — ABNORMAL LOW
Hemoglobin: 13.1
MCHC: 33.2
MCHC: 33.6
MCHC: 33.9
MCHC: 34
MCV: 93.3
MCV: 93.9
MCV: 93.3
MCV: 93.7
MCV: 94.1
Platelets: 166
Platelets: 179
Platelets: 212
Platelets: 183
RBC: 3.88 — ABNORMAL LOW
RBC: 4.08 — ABNORMAL LOW
RBC: 3.93 — ABNORMAL LOW
RBC: 3.95 — ABNORMAL LOW
RBC: 4.11 — ABNORMAL LOW
RDW: 12.4
RDW: 12.6
RDW: 13.2
RDW: 13.2
WBC: 11.4 — ABNORMAL HIGH
WBC: 8.9
WBC: 4.9
WBC: 4.9

## 2010-11-12 LAB — COMPREHENSIVE METABOLIC PANEL
ALT: 21
AST: 42 — ABNORMAL HIGH
Alkaline Phosphatase: 70
CO2: 23
Chloride: 103
GFR calc Af Amer: >60
GFR calc non Af Amer: >60
Glucose, Bld: 97
Sodium: 137
Total Bilirubin: 2 — ABNORMAL HIGH

## 2010-11-12 LAB — APTT: aPTT: >200

## 2010-11-12 LAB — BASIC METABOLIC PANEL WITH GFR
BUN: 9
CO2: 27
Glucose, Bld: 89
Potassium: 4.1
Sodium: 138

## 2010-11-12 LAB — POCT CARDIAC MARKERS
CKMB, poc: 1.3
Myoglobin, poc: 58.9
Myoglobin, poc: 60.2
Troponin i, poc: <0.05

## 2010-11-12 LAB — CK TOTAL AND CKMB (NOT AT ARMC)
CK, MB: 2.1
Relative Index: INVALID
Total CK: 59

## 2010-11-12 LAB — CARDIAC PANEL(CRET KIN+CKTOT+MB+TROPI)
CK, MB: 2
CK, MB: 2
CK, MB: 2.6
Relative Index: INVALID
Total CK: 44
Total CK: 75
Troponin I: 0.03
Troponin I: 0.03

## 2010-11-12 LAB — GLUCOSE, CAPILLARY: Glucose-Capillary: 102 — ABNORMAL HIGH

## 2010-11-12 LAB — BILIRUBIN, FRACTIONATED(TOT/DIR/INDIR)
Bilirubin, Direct: 0.1
Indirect Bilirubin: 1.1 — ABNORMAL HIGH

## 2010-11-12 LAB — HEPARIN LEVEL (UNFRACTIONATED)
Heparin Unfractionated: 0.8 — ABNORMAL HIGH
Heparin Unfractionated: 1.25 — ABNORMAL HIGH

## 2010-11-12 LAB — TROPONIN I: Troponin I: 0.03

## 2010-11-12 LAB — MAGNESIUM: Magnesium: 1.9

## 2011-02-13 ENCOUNTER — Other Ambulatory Visit: Payer: Self-pay | Admitting: Dermatology

## 2011-02-13 DIAGNOSIS — L820 Inflamed seborrheic keratosis: Secondary | ICD-10-CM | POA: Diagnosis not present

## 2011-02-13 DIAGNOSIS — D485 Neoplasm of uncertain behavior of skin: Secondary | ICD-10-CM | POA: Diagnosis not present

## 2011-02-24 DIAGNOSIS — I2510 Atherosclerotic heart disease of native coronary artery without angina pectoris: Secondary | ICD-10-CM | POA: Insufficient documentation

## 2011-02-24 DIAGNOSIS — I10 Essential (primary) hypertension: Secondary | ICD-10-CM | POA: Insufficient documentation

## 2011-02-24 DIAGNOSIS — Z87891 Personal history of nicotine dependence: Secondary | ICD-10-CM | POA: Insufficient documentation

## 2011-02-24 DIAGNOSIS — E785 Hyperlipidemia, unspecified: Secondary | ICD-10-CM | POA: Insufficient documentation

## 2011-02-25 DIAGNOSIS — I2510 Atherosclerotic heart disease of native coronary artery without angina pectoris: Secondary | ICD-10-CM | POA: Diagnosis not present

## 2011-03-14 ENCOUNTER — Other Ambulatory Visit: Payer: Self-pay | Admitting: Cardiovascular Disease

## 2011-03-14 MED ORDER — CLOPIDOGREL BISULFATE 75 MG PO TABS: 75.0000 mg | ORAL_TABLET | Freq: Every day | ORAL | Status: DC

## 2011-03-14 NOTE — Telephone Encounter (Signed)
New Refill  Patient has an appnt 2/5 but needs medication refilled today, patient can be reached at hm#   clopidogrel (PLAVIX) 75 MG tablet

## 2011-03-18 ENCOUNTER — Ambulatory Visit (INDEPENDENT_AMBULATORY_CARE_PROVIDER_SITE_OTHER): Payer: Medicare Other | Admitting: Cardiovascular Disease

## 2011-03-18 ENCOUNTER — Encounter: Payer: Self-pay | Admitting: Cardiovascular Disease

## 2011-03-18 DIAGNOSIS — I2510 Atherosclerotic heart disease of native coronary artery without angina pectoris: Secondary | ICD-10-CM | POA: Diagnosis not present

## 2011-03-18 DIAGNOSIS — E785 Hyperlipidemia, unspecified: Secondary | ICD-10-CM

## 2011-03-18 MED ORDER — CLOPIDOGREL BISULFATE 75 MG PO TABS: 75.0000 mg | ORAL_TABLET | Freq: Every day | ORAL | Status: DC

## 2011-03-18 MED ORDER — NITROGLYCERIN 0.4 MG SL SUBL: 0.4000 mg | SUBLINGUAL_TABLET | SUBLINGUAL | Status: DC | PRN

## 2011-03-18 MED ORDER — SIMVASTATIN 40 MG PO TABS: 40.0000 mg | ORAL_TABLET | Freq: Every day | ORAL | Status: DC

## 2011-03-18 MED ORDER — ENALAPRIL MALEATE 2.5 MG PO TABS: 2.5000 mg | ORAL_TABLET | Freq: Every day | ORAL | Status: DC

## 2011-03-18 NOTE — Assessment & Plan Note (Signed)
The patient's lipids are excellent. Will repeat his lipid panel prior to his next followup visit in August, 2013. He is on statin therapy.

## 2011-03-18 NOTE — Patient Instructions (Addendum)
Your physician has requested that you have an exercise tolerance test with Dr Excell Seltzer. For further information please visit [email or web address] Please also follow instruction sheet, as given.  Your physician wants you to follow-up in: AUGUST 2013. You will receive a reminder letter in the mail two months in advance. If you don't receive a letter, please call our office to schedule the follow-up appointment.  Your physician recommends that you continue on your current medications as directed. Please refer to the Current Medication list given to you today.  Your physician recommends that you return for a FASTING lipid and liver profile in AUGUST 2013--nothing to eat or drink after midnight, lab opens at 8:30.

## 2011-03-18 NOTE — Progress Notes (Signed)
HPI:  This is a 71 year old gentleman presenting for followup evaluation. The patient has coronary artery disease with a history of anterior MI in 2009. He has been treated with drug-eluting stents in the LAD and left circumflex and a bare-metal stent in the diagonal branch. He required another intervention in the proximal LAD utilizing a bare-metal stent at a later date. He was last seen in July 2012 and was stable at that time.  The patient is doing very well at present. He exercises about 50 minutes per day and achieved a heart rate of around 140 beats per minute. He has no symptoms with that level of exertion. He specifically denies chest pain, dyspnea, palpitations, or edema.  Outpatient Encounter Prescriptions as of 03/18/2011  Medication Sig Dispense Refill  . aspirin 81 MG tablet Take 81 mg by mouth daily.        . clopidogrel (PLAVIX) 75 MG tablet Take 1 tablet (75 mg total) by mouth daily.  30 tablet  6  . enalapril (VASOTEC) 2.5 MG tablet Take 2.5 mg by mouth daily.        . Multiple Vitamin (MULTIVITAMIN) tablet Take 1 tablet by mouth daily.        . nitroGLYCERIN (NITROSTAT) 0.4 MG SL tablet Place 0.4 mg under the tongue every 5 (five) minutes as needed.        . Nutritional Supplements (VITAMIN D MAINTENANCE PO) Take by mouth. Vitamin D2 1 capsule daily       . sertraline (ZOLOFT) 25 MG tablet Take 25 mg by mouth daily.        . simvastatin (ZOCOR) 40 MG tablet Take 40 mg by mouth at bedtime.        Marland Kitchen DISCONTD: diclofenac sodium (VOLTAREN) 1 % GEL Apply topically. Apply every other day         No Known Allergies  Past Medical History  Diagnosis Date  . MI (myocardial infarction)   . Stented coronary artery   . Hyperlipemia   . LBP (low back pain)   . Fibroma   . Anxiety     ROS: Negative except as per HPI  BP 122/78  Pulse 63  Ht 5\' 11"  (1.803 m)  Wt 87.544 kg (193 lb)  BMI 26.92 kg/m2  PHYSICAL EXAM: Pt is alert and oriented, NAD HEENT: normal Neck: JVP - normal,  carotids 2+= without bruits Lungs: CTA bilaterally CV: RRR without murmur or gallop Abd: soft, NT, Positive BS, no hepatomegaly Ext: no C/C/E, distal pulses intact and equal Skin: warm/dry no rash  EKG:  Normal sinus rhythm 63 beats per minute, cannot rule out anterior infarct age undetermined, possible inferior infarct age undetermined.  ASSESSMENT AND PLAN:

## 2011-03-18 NOTE — Assessment & Plan Note (Signed)
The patient is stable with good exercise capacity. He has no angina at the present time. His modifiable risk factors are very well controlled. He remains on dual antiplatelet therapy with aspirin and Plavix. As he has undergone multiple PCI procedures, I would like to proceed with an exercise treadmill study to rule out significant ischemia.

## 2011-03-24 DIAGNOSIS — J069 Acute upper respiratory infection, unspecified: Secondary | ICD-10-CM | POA: Diagnosis not present

## 2011-03-31 ENCOUNTER — Other Ambulatory Visit: Payer: Self-pay | Admitting: Cardiovascular Disease

## 2011-03-31 NOTE — Telephone Encounter (Signed)
Pt needs refill for Zocor, vasatec,plavix sent to [id number] - [place]

## 2011-03-31 NOTE — Telephone Encounter (Signed)
I spoke with the pt's pharmacy and they do not need prescriptions from our office. They said the pt needs authorization to refill Sertraline from Dr Valentina Lucks. I left a message on the pt's voicemail with this information.

## 2011-03-31 NOTE — Telephone Encounter (Signed)
Pt called back rx's are there but it too soon for refill, so needs auth called to [facility]

## 2011-05-07 DIAGNOSIS — I2510 Atherosclerotic heart disease of native coronary artery without angina pectoris: Secondary | ICD-10-CM | POA: Diagnosis not present

## 2011-05-07 DIAGNOSIS — E7800 Pure hypercholesterolemia, unspecified: Secondary | ICD-10-CM | POA: Diagnosis not present

## 2011-05-07 DIAGNOSIS — K5900 Constipation, unspecified: Secondary | ICD-10-CM | POA: Diagnosis not present

## 2011-05-07 DIAGNOSIS — Z0000 Encounter for general adult medical examination without abnormal findings: Secondary | ICD-10-CM | POA: Diagnosis not present

## 2011-05-07 DIAGNOSIS — F411 Generalized anxiety disorder: Secondary | ICD-10-CM | POA: Diagnosis not present

## 2011-05-07 DIAGNOSIS — Z1331 Encounter for screening for depression: Secondary | ICD-10-CM | POA: Diagnosis not present

## 2011-05-26 DIAGNOSIS — M720 Palmar fascial fibromatosis [Dupuytren]: Secondary | ICD-10-CM | POA: Diagnosis not present

## 2011-05-28 ENCOUNTER — Telehealth: Payer: Self-pay | Admitting: Cardiovascular Disease

## 2011-05-28 ENCOUNTER — Ambulatory Visit (INDEPENDENT_AMBULATORY_CARE_PROVIDER_SITE_OTHER): Payer: Medicare Other | Admitting: Cardiovascular Disease

## 2011-05-28 DIAGNOSIS — I2510 Atherosclerotic heart disease of native coronary artery without angina pectoris: Secondary | ICD-10-CM

## 2011-05-28 DIAGNOSIS — E785 Hyperlipidemia, unspecified: Secondary | ICD-10-CM

## 2011-05-28 NOTE — Telephone Encounter (Signed)
New Problem:    Patient is seeing Dr. Allena Katz at [facility] in January 2014 so the middle of 2014 looks good to him.  Please call back if you have any concerns.

## 2011-05-28 NOTE — Progress Notes (Signed)
Exercise Treadmill Test  Pre-Exercise Testing Evaluation Rhythm: sinus bradycardia  Rate: 55   PR:  21 QRS:  .10  QT:  43 QTc: 41     Test  Exercise Tolerance Test Ordering MD: Tonny Bollman, MD  Interpreting MD:  Tonny Bollman, MD  Unique Test No: 1  Treadmill:  1  Indication for ETT: known ASHD  Contraindication to ETT: No   Stress Modality: exercise - treadmill  Cardiac Imaging Performed: non   Protocol: standard Bruce - maximal  Max BP:  184/86  Max MPHR (bpm):  151 85% MPR (bpm):  128  MPHR obtained (bpm):  154 % MPHR obtained:  101  Reached 85% MPHR (min:sec): 11:44 Total Exercise Time (min-sec):  13:00  Workload in METS:  15.3 Borg Scale: 15  Reason ETT Terminated:  desired heart rate attained    ST Segment Analysis At Rest: normal ST segments - no evidence of significant ST depression With Exercise: no evidence of significant ST depression  Other Information Arrhythmia:  No Angina during ETT:  absent (0) Quality of ETT:  diagnostic  ETT Interpretation:  normal - no evidence of ischemia by ST analysis  Comments: Excellent exercise tolerance. No angina, significant ST change, or arrhythmia.  Recommendations: Continue exercise program and current medical therapy.

## 2011-05-28 NOTE — Telephone Encounter (Signed)
1 Year recall entered into system.

## 2011-06-03 DIAGNOSIS — H903 Sensorineural hearing loss, bilateral: Secondary | ICD-10-CM | POA: Diagnosis not present

## 2011-07-08 ENCOUNTER — Telehealth: Payer: Self-pay | Admitting: Cardiovascular Disease

## 2011-07-08 NOTE — Telephone Encounter (Signed)
I spoke with the pt's wife and made her aware of Dr Cooper's recommendation.  

## 2011-07-08 NOTE — Telephone Encounter (Signed)
F/u   Patient returning nurse call, he would like to be reached at [phone] talk with patient or his wife carol.

## 2011-07-08 NOTE — Telephone Encounter (Signed)
Please return call to patient at [phone] regarding plavix hold for upcoming colonoscopy.

## 2011-07-08 NOTE — Telephone Encounter (Signed)
I spoke with the pt's wife and the pt was scheduled to have a colonoscopy on 07/09/11 but did not know that he needed to hold his plavix 5 days prior. The colonoscopy has not been rescheduled at this time.  The pt would like to know if he is okay to hold Plavix for 5 days, the pt can remain on ASA for procedure per Dr Danise Edge.  I will forward this message to Dr Excell Seltzer for review.

## 2011-07-08 NOTE — Telephone Encounter (Signed)
Left message to call back  

## 2011-07-08 NOTE — Telephone Encounter (Signed)
This is ok. He is at low risk of problems. He should resume asap following the colonoscopy at the instruction of the gastroenterologist.

## 2011-07-22 DIAGNOSIS — Z1211 Encounter for screening for malignant neoplasm of colon: Secondary | ICD-10-CM | POA: Diagnosis not present

## 2011-08-04 DIAGNOSIS — B029 Zoster without complications: Secondary | ICD-10-CM | POA: Diagnosis not present

## 2011-08-13 DIAGNOSIS — R05 Cough: Secondary | ICD-10-CM | POA: Diagnosis not present

## 2011-08-13 DIAGNOSIS — R059 Cough, unspecified: Secondary | ICD-10-CM | POA: Diagnosis not present

## 2011-08-15 DIAGNOSIS — H109 Unspecified conjunctivitis: Secondary | ICD-10-CM | POA: Diagnosis not present

## 2011-11-19 DIAGNOSIS — Z23 Encounter for immunization: Secondary | ICD-10-CM | POA: Diagnosis not present

## 2011-12-22 DIAGNOSIS — M720 Palmar fascial fibromatosis [Dupuytren]: Secondary | ICD-10-CM | POA: Diagnosis not present

## 2012-03-02 DIAGNOSIS — I2510 Atherosclerotic heart disease of native coronary artery without angina pectoris: Secondary | ICD-10-CM | POA: Diagnosis not present

## 2012-03-02 DIAGNOSIS — E785 Hyperlipidemia, unspecified: Secondary | ICD-10-CM | POA: Diagnosis not present

## 2012-03-02 DIAGNOSIS — I10 Essential (primary) hypertension: Secondary | ICD-10-CM | POA: Diagnosis not present

## 2012-04-02 ENCOUNTER — Other Ambulatory Visit: Payer: Self-pay | Admitting: Cardiovascular Disease

## 2012-05-07 DIAGNOSIS — Z1331 Encounter for screening for depression: Secondary | ICD-10-CM | POA: Diagnosis not present

## 2012-05-07 DIAGNOSIS — I2510 Atherosclerotic heart disease of native coronary artery without angina pectoris: Secondary | ICD-10-CM | POA: Diagnosis not present

## 2012-05-07 DIAGNOSIS — E7800 Pure hypercholesterolemia, unspecified: Secondary | ICD-10-CM | POA: Diagnosis not present

## 2012-05-07 DIAGNOSIS — F411 Generalized anxiety disorder: Secondary | ICD-10-CM | POA: Diagnosis not present

## 2012-05-11 ENCOUNTER — Other Ambulatory Visit: Payer: Self-pay | Admitting: Cardiovascular Disease

## 2012-05-14 DIAGNOSIS — H0230 Blepharochalasis unspecified eye, unspecified eyelid: Secondary | ICD-10-CM | POA: Diagnosis not present

## 2012-05-14 DIAGNOSIS — H2510 Age-related nuclear cataract, unspecified eye: Secondary | ICD-10-CM | POA: Diagnosis not present

## 2012-05-14 DIAGNOSIS — H04129 Dry eye syndrome of unspecified lacrimal gland: Secondary | ICD-10-CM | POA: Diagnosis not present

## 2012-05-31 ENCOUNTER — Encounter: Payer: Self-pay | Admitting: Cardiovascular Disease

## 2012-05-31 ENCOUNTER — Ambulatory Visit (INDEPENDENT_AMBULATORY_CARE_PROVIDER_SITE_OTHER): Payer: Medicare Other | Admitting: Cardiovascular Disease

## 2012-05-31 VITALS — BP 122/76 | HR 48 | Ht 71.0 in | Wt 196.0 lb

## 2012-05-31 DIAGNOSIS — I2510 Atherosclerotic heart disease of native coronary artery without angina pectoris: Secondary | ICD-10-CM

## 2012-05-31 NOTE — Patient Instructions (Signed)
Your physician has requested that you have an exercise tolerance test in 1 YEAR with Dr Cooper. For further information please visit [email or web address]. Please also follow instruction sheet, as given.  Your physician recommends that you continue on your current medications as directed. Please refer to the Current Medication list given to you today.  

## 2012-05-31 NOTE — Progress Notes (Signed)
HPI:   72 year old gentleman presenting for followup evaluation. The patient has coronary artery disease and initially presented in 2009 with an anterior MI. He's been treated with drug-eluting stents in the LAD and left circumflex and a bare-metal stent in the diagonal. He had another intervention with a bare-metal stent in the LAD. He had an exercise treadmill study one year ago where he exercised for 13 minutes according to the Bruce protocol. He achieved a workload of 15.3 METs and had no chest pain or EKG changes. He obtained 101% of his maximum predicted heart rate.  Lipids were recently drawn with a cholesterol of 141, post-rest 93, LDL 59, and HDL 67.  The patient is doing very well. He exercises every day and gets his heart rate up to the 140 range. He has no exertional chest pain or pressure. He feels well and has a perspective that his heart disease is now "more in the background." He is tolerating his medications well and is compliant with them.   He discontinued Plavix several months ago. He continues on long-term aspirin for antiplatelet therapy.  Outpatient Encounter Prescriptions as of 05/31/2012  Medication Sig Dispense Refill  . aspirin 81 MG tablet Take 81 mg by mouth daily.        . enalapril (VASOTEC) 2.5 MG tablet TAKE 1 TABLET BY MOUTH EVERY DAY  90 tablet  0  . Multiple Vitamin (MULTIVITAMIN) tablet Take 1 tablet by mouth daily.        . nitroGLYCERIN (NITROSTAT) 0.4 MG SL tablet Place 1 tablet (0.4 mg total) under the tongue every 5 (five) minutes as needed.  25 tablet  3  . Nutritional Supplements (VITAMIN D MAINTENANCE PO) Take by mouth. Vitamin D2 1 capsule daily       . sertraline (ZOLOFT) 25 MG tablet Take 25 mg by mouth daily.        . simvastatin (ZOCOR) 40 MG tablet TAKE 1 TABLET AT BEDTIME  90 tablet  0  . [DISCONTINUED] clopidogrel (PLAVIX) 75 MG tablet Take 1 tablet (75 mg total) by mouth daily.  90 tablet  3   No facility-administered encounter medications  on file as of 05/31/2012.    No Known Allergies  Past Medical History  Diagnosis Date  . MI (myocardial infarction)   . Stented coronary artery   . Hyperlipemia   . LBP (low back pain)   . Fibroma   . Anxiety     ROS: Negative except as per HPI  BP 122/76  Pulse 48  Ht 5\' 11"  (1.803 m)  Wt 88.905 kg (196 lb)  BMI 27.35 kg/m2  SpO2 98%  PHYSICAL EXAM: Pt is alert and oriented, NAD HEENT: normal Neck: JVP - normal, carotids 2+= without bruits Lungs: CTA bilaterally CV: RRR without murmur or gallop Abd: soft, NT, Positive BS, no hepatomegaly Ext: no C/C/E, distal pulses intact and equal Skin: warm/dry no rash  EKG:  Marked sinus bradycardia 48 beats per minute, right bundle branch block, otherwise within normal limits.  ASSESSMENT AND PLAN: 1. Coronary artery disease, native vessel. The patient remained stable without anginal symptoms. He exercises at a good workload. He will continue his same medical program and I will see him back in one year with an exercise treadmill study.  2. Hyperlipidemia. He remains on simvastatin with a recent lipid panel demonstrating lipid values at goal.  For followup I will see him back in 12 months with an exercise treadmill study. In the interim he will  see Dr. Allena Katz at [facility].  Tonny Bollman 05/31/2012 12:41 PM

## 2012-06-28 ENCOUNTER — Other Ambulatory Visit: Payer: Self-pay | Admitting: Cardiovascular Disease

## 2012-08-18 ENCOUNTER — Other Ambulatory Visit: Payer: Self-pay | Admitting: Cardiovascular Disease

## 2012-10-11 ENCOUNTER — Other Ambulatory Visit: Payer: Self-pay | Admitting: Cardiovascular Disease

## 2012-11-18 DIAGNOSIS — Z23 Encounter for immunization: Secondary | ICD-10-CM | POA: Diagnosis not present

## 2012-12-21 DIAGNOSIS — J209 Acute bronchitis, unspecified: Secondary | ICD-10-CM | POA: Diagnosis not present

## 2012-12-28 DIAGNOSIS — J209 Acute bronchitis, unspecified: Secondary | ICD-10-CM | POA: Diagnosis not present

## 2013-01-03 DIAGNOSIS — M720 Palmar fascial fibromatosis [Dupuytren]: Secondary | ICD-10-CM | POA: Diagnosis not present

## 2013-01-07 ENCOUNTER — Other Ambulatory Visit: Payer: Self-pay | Admitting: Cardiovascular Disease

## 2013-01-07 ENCOUNTER — Other Ambulatory Visit: Payer: Self-pay

## 2013-01-07 MED ORDER — ENALAPRIL MALEATE 2.5 MG PO TABS: ORAL_TABLET | ORAL | Status: DC

## 2013-01-10 DIAGNOSIS — J209 Acute bronchitis, unspecified: Secondary | ICD-10-CM | POA: Diagnosis not present

## 2013-03-08 DIAGNOSIS — I2510 Atherosclerotic heart disease of native coronary artery without angina pectoris: Secondary | ICD-10-CM | POA: Diagnosis not present

## 2013-05-09 DIAGNOSIS — Z1331 Encounter for screening for depression: Secondary | ICD-10-CM | POA: Diagnosis not present

## 2013-05-09 DIAGNOSIS — Z0000 Encounter for general adult medical examination without abnormal findings: Secondary | ICD-10-CM | POA: Diagnosis not present

## 2013-05-09 DIAGNOSIS — E7800 Pure hypercholesterolemia, unspecified: Secondary | ICD-10-CM | POA: Diagnosis not present

## 2013-05-09 DIAGNOSIS — Z23 Encounter for immunization: Secondary | ICD-10-CM | POA: Diagnosis not present

## 2013-06-01 ENCOUNTER — Ambulatory Visit (INDEPENDENT_AMBULATORY_CARE_PROVIDER_SITE_OTHER): Payer: Medicare Other | Admitting: Cardiovascular Disease

## 2013-06-01 ENCOUNTER — Encounter: Payer: Self-pay | Admitting: Cardiovascular Disease

## 2013-06-01 DIAGNOSIS — I2510 Atherosclerotic heart disease of native coronary artery without angina pectoris: Secondary | ICD-10-CM | POA: Diagnosis not present

## 2013-06-01 NOTE — Progress Notes (Signed)
Exercise Treadmill Test  Pre-Exercise Testing Evaluation Rhythm: normal sinus  Rate: 61 bpm     Test  Exercise Tolerance Test Ordering MD: Sherren Mocha, MD  Interpreting MD: Sherren Mocha, MD  Unique Test No: 1  Treadmill:  1  Indication for ETT: known ASHD  Contraindication to ETT: No   Stress Modality: exercise - treadmill  Cardiac Imaging Performed: non   Protocol: standard Bruce - maximal  Max BP:  204/82  Max MPHR (bpm):  149 85% MPR (bpm):  127  MPHR obtained (bpm):  134 % MPHR obtained:  89  Reached 85% MPHR (min:sec):  10:30 Total Exercise Time (min-sec):  12:00  Workload in METS:  13.1 Borg Scale: 15  Reason ETT Terminated:  fatigue    ST Segment Analysis At Rest: normal ST segments - no evidence of significant ST depression With Exercise: no evidence of significant ST depression  Other Information Arrhythmia:  No Angina during ETT:  absent (0) Quality of ETT:  diagnostic  ETT Interpretation:  normal - no evidence of ischemia by ST analysis  Comments: 1.Excellent exercise tolerance 2. No angina, arrhythmia, or significant ST change with exertion.   Recommendations: Continue current medical therapy and exercise regimen

## 2013-06-01 NOTE — Patient Instructions (Signed)
Your physician wants you to follow-up in: 1 YEAR with Dr Cooper.  You will receive a reminder letter in the mail two months in advance. If you don't receive a letter, please call our office to schedule the follow-up appointment.  

## 2013-08-15 ENCOUNTER — Other Ambulatory Visit: Payer: Self-pay | Admitting: Cardiovascular Disease

## 2013-10-06 ENCOUNTER — Other Ambulatory Visit: Payer: Self-pay | Admitting: Cardiovascular Disease

## 2013-10-18 DIAGNOSIS — L255 Unspecified contact dermatitis due to plants, except food: Secondary | ICD-10-CM | POA: Diagnosis not present

## 2013-10-20 DIAGNOSIS — L259 Unspecified contact dermatitis, unspecified cause: Secondary | ICD-10-CM | POA: Diagnosis not present

## 2013-11-02 DIAGNOSIS — Z23 Encounter for immunization: Secondary | ICD-10-CM | POA: Diagnosis not present

## 2013-12-10 ENCOUNTER — Other Ambulatory Visit: Payer: Self-pay | Admitting: Cardiovascular Disease

## 2013-12-13 DIAGNOSIS — M25562 Pain in left knee: Secondary | ICD-10-CM | POA: Diagnosis not present

## 2014-01-02 DIAGNOSIS — M720 Palmar fascial fibromatosis [Dupuytren]: Secondary | ICD-10-CM | POA: Diagnosis not present

## 2014-01-04 ENCOUNTER — Other Ambulatory Visit: Payer: Self-pay | Admitting: Cardiovascular Disease

## 2014-01-09 DIAGNOSIS — M5416 Radiculopathy, lumbar region: Secondary | ICD-10-CM | POA: Diagnosis not present

## 2014-01-23 DIAGNOSIS — M25562 Pain in left knee: Secondary | ICD-10-CM | POA: Diagnosis not present

## 2014-01-23 DIAGNOSIS — M545 Low back pain: Secondary | ICD-10-CM | POA: Diagnosis not present

## 2014-02-02 ENCOUNTER — Other Ambulatory Visit: Payer: Self-pay | Admitting: Cardiovascular Disease

## 2014-02-04 ENCOUNTER — Other Ambulatory Visit: Payer: Self-pay | Admitting: Cardiovascular Disease

## 2014-02-08 DIAGNOSIS — M545 Low back pain: Secondary | ICD-10-CM | POA: Diagnosis not present

## 2014-02-08 DIAGNOSIS — M25562 Pain in left knee: Secondary | ICD-10-CM | POA: Diagnosis not present

## 2014-02-17 DIAGNOSIS — M25562 Pain in left knee: Secondary | ICD-10-CM | POA: Diagnosis not present

## 2014-02-17 DIAGNOSIS — M545 Low back pain: Secondary | ICD-10-CM | POA: Diagnosis not present

## 2014-02-19 ENCOUNTER — Other Ambulatory Visit: Payer: Self-pay | Admitting: Cardiovascular Disease

## 2014-02-24 DIAGNOSIS — Z129 Encounter for screening for malignant neoplasm, site unspecified: Secondary | ICD-10-CM | POA: Diagnosis not present

## 2014-02-24 DIAGNOSIS — J069 Acute upper respiratory infection, unspecified: Secondary | ICD-10-CM | POA: Diagnosis not present

## 2014-03-06 ENCOUNTER — Other Ambulatory Visit: Payer: Self-pay | Admitting: Cardiovascular Disease

## 2014-03-19 ENCOUNTER — Other Ambulatory Visit: Payer: Self-pay | Admitting: Cardiovascular Disease

## 2014-03-29 DIAGNOSIS — M25562 Pain in left knee: Secondary | ICD-10-CM | POA: Diagnosis not present

## 2014-03-29 DIAGNOSIS — M545 Low back pain: Secondary | ICD-10-CM | POA: Diagnosis not present

## 2014-04-03 ENCOUNTER — Other Ambulatory Visit: Payer: Self-pay | Admitting: Cardiovascular Disease

## 2014-04-03 DIAGNOSIS — M720 Palmar fascial fibromatosis [Dupuytren]: Secondary | ICD-10-CM | POA: Diagnosis not present

## 2014-04-07 DIAGNOSIS — M545 Low back pain: Secondary | ICD-10-CM | POA: Diagnosis not present

## 2014-04-07 DIAGNOSIS — M25562 Pain in left knee: Secondary | ICD-10-CM | POA: Diagnosis not present

## 2014-04-18 DIAGNOSIS — M25562 Pain in left knee: Secondary | ICD-10-CM | POA: Diagnosis not present

## 2014-04-18 DIAGNOSIS — M545 Low back pain: Secondary | ICD-10-CM | POA: Diagnosis not present

## 2014-04-26 DIAGNOSIS — M545 Low back pain: Secondary | ICD-10-CM | POA: Diagnosis not present

## 2014-04-26 DIAGNOSIS — M25562 Pain in left knee: Secondary | ICD-10-CM | POA: Diagnosis not present

## 2014-05-03 DIAGNOSIS — M25552 Pain in left hip: Secondary | ICD-10-CM | POA: Diagnosis not present

## 2014-05-03 DIAGNOSIS — S335XXA Sprain of ligaments of lumbar spine, initial encounter: Secondary | ICD-10-CM | POA: Diagnosis not present

## 2014-05-09 DIAGNOSIS — M545 Low back pain: Secondary | ICD-10-CM | POA: Diagnosis not present

## 2014-05-09 DIAGNOSIS — M25562 Pain in left knee: Secondary | ICD-10-CM | POA: Diagnosis not present

## 2014-05-11 DIAGNOSIS — I2510 Atherosclerotic heart disease of native coronary artery without angina pectoris: Secondary | ICD-10-CM | POA: Diagnosis not present

## 2014-05-11 DIAGNOSIS — F419 Anxiety disorder, unspecified: Secondary | ICD-10-CM | POA: Diagnosis not present

## 2014-05-11 DIAGNOSIS — E780 Pure hypercholesterolemia: Secondary | ICD-10-CM | POA: Diagnosis not present

## 2014-05-11 DIAGNOSIS — Z1389 Encounter for screening for other disorder: Secondary | ICD-10-CM | POA: Diagnosis not present

## 2014-05-11 DIAGNOSIS — Z0000 Encounter for general adult medical examination without abnormal findings: Secondary | ICD-10-CM | POA: Diagnosis not present

## 2014-05-11 DIAGNOSIS — F411 Generalized anxiety disorder: Secondary | ICD-10-CM | POA: Diagnosis not present

## 2014-05-11 DIAGNOSIS — I252 Old myocardial infarction: Secondary | ICD-10-CM | POA: Diagnosis not present

## 2014-05-16 ENCOUNTER — Other Ambulatory Visit: Payer: Self-pay | Admitting: Cardiovascular Disease

## 2014-05-17 DIAGNOSIS — H04123 Dry eye syndrome of bilateral lacrimal glands: Secondary | ICD-10-CM | POA: Diagnosis not present

## 2014-05-17 DIAGNOSIS — H40013 Open angle with borderline findings, low risk, bilateral: Secondary | ICD-10-CM | POA: Diagnosis not present

## 2014-05-17 DIAGNOSIS — H2513 Age-related nuclear cataract, bilateral: Secondary | ICD-10-CM | POA: Diagnosis not present

## 2014-05-29 ENCOUNTER — Other Ambulatory Visit: Payer: Self-pay | Admitting: Cardiovascular Disease

## 2014-05-31 DIAGNOSIS — M545 Low back pain: Secondary | ICD-10-CM | POA: Diagnosis not present

## 2014-05-31 DIAGNOSIS — M25562 Pain in left knee: Secondary | ICD-10-CM | POA: Diagnosis not present

## 2014-06-01 ENCOUNTER — Encounter: Payer: Self-pay | Admitting: Cardiovascular Disease

## 2014-06-01 ENCOUNTER — Ambulatory Visit (INDEPENDENT_AMBULATORY_CARE_PROVIDER_SITE_OTHER): Payer: Medicare Other | Admitting: Cardiovascular Disease

## 2014-06-01 DIAGNOSIS — E785 Hyperlipidemia, unspecified: Secondary | ICD-10-CM | POA: Diagnosis not present

## 2014-06-01 DIAGNOSIS — I2510 Atherosclerotic heart disease of native coronary artery without angina pectoris: Secondary | ICD-10-CM

## 2014-06-01 DIAGNOSIS — I208 Other forms of angina pectoris: Secondary | ICD-10-CM

## 2014-06-01 LAB — LIPID PANEL
Cholesterol: 138 mg/dL (ref 0–200)
HDL: 66.3 mg/dL
LDL Cholesterol: 61 mg/dL (ref 0–99)
NonHDL: 71.7
Total CHOL/HDL Ratio: 2
Triglycerides: 56 mg/dL (ref 0.0–149.0)
VLDL: 11.2 mg/dL (ref 0.0–40.0)

## 2014-06-01 LAB — HEPATIC FUNCTION PANEL
ALBUMIN: 4.6 g/dL (ref 3.5–5.2)
ALT: 15 U/L (ref 0–53)
AST: 19 U/L (ref 0–37)
Alkaline Phosphatase: 54 U/L (ref 39–117)
Bilirubin, Direct: 0.2 mg/dL (ref 0.0–0.3)
Total Bilirubin: 0.9 mg/dL (ref 0.2–1.2)
Total Protein: 7.6 g/dL (ref 6.0–8.3)

## 2014-06-01 NOTE — Progress Notes (Signed)
Cardiology Office Note Date:  06/01/2014   ID:  Zachary Barron, DOB 1940-04-16, MRN [id number]  PCP:  Irven Shelling, MD  Cardiologist:  Sherren Mocha, MD    Chief Complaint  Patient presents with  . Chest Pain     History of Present Illness: Zachary Barron is a 74 y.o. male who presents for follow-up of coronary artery disease. The patient presented in 2009 with an anterior infarct. He's been treated with drug-eluting stents in the LAD and left circumflex and a bare-metal stent in the diagonal. There was a subsequent intervention done in the LAD with a bare-metal stent. The patient's last treadmill study in April 2015 demonstrated excellent exercise tolerance as he completed stage IV of the Bruce protocol. There were no significant EKG changes or chest pain.  Over the past few months, he reports mild chest discomfort with physical exertion. This occurs with treadmill exercise on a 25 degree incline. Symptoms resolve with lowering the incline and slowing his pace. He describes a left-sided 'discomfort' that feels like pressure and pain, up to 5/10 in intensity. He has also developed some limitation related to left leg and hip pain. He's seen orthopedics with recommendation for conservative therapy.   No anginal symptoms with normal activities or light exercise. He hasn't required NTG. No shortness or breath or edema. Reports occasional palpitations.    Past Medical History  Diagnosis Date  . MI (myocardial infarction)   . Stented coronary artery   . Hyperlipemia   . LBP (low back pain)   . Fibroma   . Anxiety     Past Surgical History  Procedure Laterality Date  . Coronary stent placement      Allergies:   Review of patient's allergies indicates no known allergies.   Social History:  The patient  reports that he quit smoking about 39 years ago. He does not have any smokeless tobacco history on file. He reports that he does not drink alcohol or use illicit drugs.   Family  History:  The patient's  family history includes Hypertension in his mother; Parkinsonism in his other.    ROS:  Please see the history of present illness.  Otherwise, review of systems is positive for back pain, hip pain, muscle pain.  All other systems are reviewed and negative.    PHYSICAL EXAM: VS:  BP 112/68 mmHg  Pulse 51  Ht 5\' 11"  (1.803 m)  Wt 195 lb 6.4 oz (88.633 kg)  BMI 27.26 kg/m2 , BMI Body mass index is 27.26 kg/(m^2). GEN: Well nourished, well developed, in no acute distress HEENT: normal Neck: no JVD, no masses. No carotid bruits Cardiac: RRR without murmur or gallop                Respiratory:  clear to auscultation bilaterally, normal work of breathing GI: soft, nontender, nondistended, + BS MS: no deformity or atrophy Ext: no pretibial edema, pedal pulses 2+= bilaterally Skin: warm and dry, no rash Neuro:  Strength and sensation are intact Psych: euthymic mood, full affect  EKG:  EKG is ordered today. The ekg ordered today shows Sinus bradycardia 51 bpm, RBBB.  No change from previous tracings.  Recent Labs: No results found for requested labs within last 365 days.   Lipid Panel     Component Value Date/Time   CHOL 138 09/11/2010 0918   TRIG 52.0 09/11/2010 0918   HDL 79.80 09/11/2010 0918   CHOLHDL 2 09/11/2010 0918   VLDL 10.4 09/11/2010 9163  [place] 48 09/11/2010 0918      Wt Readings from Last 3 Encounters:  06/01/14 195 lb 6.4 oz (88.633 kg)  05/31/12 196 lb (88.905 kg)  03/18/11 193 lb (87.544 kg)     Cardiac Studies Reviewed: Exercise treadmill test 06/01/2013: Exercise Tolerance Test Ordering MD: Sherren Mocha, MD  Interpreting MD: Sherren Mocha, MD  Unique Test No: 1  Treadmill: 1  Indication for ETT: known ASHD  Contraindication to ETT: No   Stress Modality: exercise - treadmill  Cardiac Imaging Performed: non   Protocol: standard Bruce - maximal  Max BP: 204/82  Max MPHR (bpm): 149 85% MPR (bpm): 127  MPHR  obtained (bpm): 134 % MPHR obtained: 89  Reached 85% MPHR (min:sec): 10:30 Total Exercise Time (min-sec): 12:00  Workload in METS: 13.1 Borg Scale: 15  Reason ETT Terminated: fatigue    ST Segment Analysis At Rest:normal ST segments - no evidence of significant ST depression With Exercise:no evidence of significant ST depression  Other Information Arrhythmia: NoAngina during ETT: absent (0) Quality of ETT: diagnostic  ETT Interpretation: normal - no evidence of ischemia by ST analysis  Comments: 1.Excellent exercise tolerance 2. No angina, arrhythmia, or significant ST change with exertion.   Recommendations: Continue current medical therapy and exercise regimen        ASSESSMENT AND PLAN: 1.   CAD, native vessel, with exertional angina CCS functional class 1-2. The patient has developed symptoms of exertional angina since his visit here last year. This only occurs with fairly high level exercise. We discussed diagnostic and treatment considerations. Since he is not currently limited in any of his regular activities, he would like to avoid adding medication. We did discuss options of long-acting nitrates, low-dose amlodipine, or Ranexa. I have recommended an exercise Myoview stress test for further risk stratification. He sees Dr. Posey Pronto at [facility] in a few weeks and will have further discussion at that time. His stress test  Result should be available by then as well.  The patient has excellent insight and will contact us if there is any progression of his anginal symptoms.  2. Hyperlipidemia : Recent lipids reviewed with a cholesterol of 176, triglycerides 220, LDL 64, HDL 68. Non-HDL cholesterol was 108. He was nonfasting for this lab and requests repeat lipid panel which will be done today. His previous total cholesterol is 129. There has been noted significant change in his body weight or his diet.  Current medicines are reviewed with  the patient today.  The patient does not have concerns regarding medicines.  Labs/ tests ordered today include:   Orders Placed This Encounter  Procedures  . Lipid panel  . Hepatic function panel  . Myocardial Perfusion Imaging  . EKG 12-Lead    Disposition:   FU one year. Call if change in symptoms.  Signed, Sherren Mocha, MD  06/01/2014 12:46 PM    [address], [place], [place]  [address] Phone: [phone]; Fax: [phone]

## 2014-06-01 NOTE — Patient Instructions (Signed)
Medication Instructions:  Your physician recommends that you continue on your current medications as directed. Please refer to the Current Medication list given to you today.  Labwork: Your physician recommends that you have lab work today: LIPID and LIVER  Testing/Procedures: Your physician has requested that you have an exercise stress myoview. For further information please visit [email or web address]. Please follow instruction sheet, as given.  Follow-Up: Your physician wants you to follow-up in: 1 YEAR with Dr Burt Knack.  You will receive a reminder letter in the mail two months in advance. If you don't receive a letter, please call our office to schedule the follow-up appointment.  Any Other Special Instructions Will Be Listed Below (If Applicable).

## 2014-06-08 ENCOUNTER — Telehealth (HOSPITAL_COMMUNITY): Payer: Self-pay

## 2014-06-08 DIAGNOSIS — S335XXD Sprain of ligaments of lumbar spine, subsequent encounter: Secondary | ICD-10-CM | POA: Diagnosis not present

## 2014-06-08 DIAGNOSIS — M7062 Trochanteric bursitis, left hip: Secondary | ICD-10-CM | POA: Diagnosis not present

## 2014-06-08 NOTE — Telephone Encounter (Signed)
Patient given detailed instructions per Myocardial Perfusion Study Information Sheet for test on 06-12-2014 at 8:45 am. Patient verbalized understanding. Irven Baltimore, RN.

## 2014-06-08 NOTE — Telephone Encounter (Signed)
Left message on voicemail in reference to upcoming appointment scheduled on 06-12-2014 at 8:45am with detailed instructions given per Myocardial Perfusion Study Information Sheet for the test. Phone number given for call back for any questions. Irven Baltimore, RN.

## 2014-06-12 ENCOUNTER — Ambulatory Visit (HOSPITAL_COMMUNITY): Payer: Medicare Other | Attending: Cardiology

## 2014-06-12 DIAGNOSIS — R079 Chest pain, unspecified: Secondary | ICD-10-CM | POA: Diagnosis not present

## 2014-06-12 DIAGNOSIS — I208 Other forms of angina pectoris: Secondary | ICD-10-CM | POA: Diagnosis not present

## 2014-06-12 MED ORDER — TECHNETIUM TC 99M SESTAMIBI GENERIC - CARDIOLITE
10.0000 | Freq: Once | INTRAVENOUS | Status: AC | PRN
  Administered 2014-06-12: 11 via INTRAVENOUS

## 2014-06-12 MED ORDER — TECHNETIUM TC 99M SESTAMIBI GENERIC - CARDIOLITE
30.0000 | Freq: Once | INTRAVENOUS | Status: AC | PRN
  Administered 2014-06-12: 30 via INTRAVENOUS

## 2014-06-13 ENCOUNTER — Telehealth: Payer: Self-pay | Admitting: Cardiovascular Disease

## 2014-06-13 ENCOUNTER — Other Ambulatory Visit: Payer: Self-pay | Admitting: Cardiovascular Disease

## 2014-06-13 NOTE — Telephone Encounter (Signed)
New message  ° ° °Patient calling for test results.   °

## 2014-06-14 NOTE — Telephone Encounter (Signed)
Dr Burt Knack left message for the pt yesterday.  Notes Recorded by Sherren Mocha, MD on 06/13/2014 at 5:26 PM Left message on patient's voicemail. He will call back if any questions or if he wants to review results in more detail. Also routed to his cardiologist at [facility], Dr Posey Pronto.

## 2014-06-14 NOTE — Telephone Encounter (Signed)
The pt called back and would like to speak with Dr Burt Knack about his myoview results. The pt can be reached at [phone].

## 2014-06-15 LAB — MYOCARDIAL PERFUSION IMAGING
CHL CUP STRESS STAGE 2 DBP: 84 mmHg
CHL CUP STRESS STAGE 4 GRADE: 10 %
CHL CUP STRESS STAGE 4 HR: 90 {beats}/min
CHL CUP STRESS STAGE 4 SPEED: 1.7 mph
CHL CUP STRESS STAGE 5 DBP: 78 mmHg
CHL CUP STRESS STAGE 5 GRADE: 12 %
CHL CUP STRESS STAGE 5 SBP: 167 mmHg
CHL CUP STRESS STAGE 6 DBP: 81 mmHg
CHL CUP STRESS STAGE 6 SBP: 178 mmHg
CHL CUP STRESS STAGE 8 DBP: 80 mmHg
CHL CUP STRESS STAGE 8 SBP: 183 mmHg
CHL CUP STRESS STAGE 9 SBP: 147 mmHg
CSEPHR: 95 %
CSEPPHR: 123 {beats}/min
Estimated workload: 11 METS
Exercise duration (min): 9 min
Exercise duration (sec): 35 s
LHR: 0.27
LV dias vol: 125 mL
LV sys vol: 58 mL
MPHR: 148 {beats}/min
NUC STRESS EF: 54 %
Percent of predicted max HR: 83 %
Rest HR: 48 {beats}/min
SDS: 0
SRS: 4
SSS: 4
Stage 1 DBP: 77 mmHg
Stage 1 Grade: 0 %
Stage 1 HR: 56 {beats}/min
Stage 1 SBP: 118 mmHg
Stage 1 Speed: 0 mph
Stage 2 Grade: 0 %
Stage 2 HR: 53 {beats}/min
Stage 2 SBP: 135 mmHg
Stage 2 Speed: 0 mph
Stage 3 Grade: 0.2 %
Stage 3 HR: 53 {beats}/min
Stage 3 Speed: 0.1 mph
Stage 4 DBP: 76 mmHg
Stage 4 SBP: 161 mmHg
Stage 5 HR: 104 {beats}/min
Stage 5 Speed: 2.5 mph
Stage 6 Grade: 14 %
Stage 6 HR: 130 {beats}/min
Stage 6 Speed: 3.4 mph
Stage 7 Grade: 16 %
Stage 7 HR: 123 {beats}/min
Stage 7 Speed: 4.2 mph
Stage 8 Grade: 0 %
Stage 8 HR: 109 {beats}/min
Stage 8 Speed: 0 mph
Stage 9 DBP: 87 mmHg
Stage 9 Grade: 0 %
Stage 9 HR: 63 {beats}/min
Stage 9 Speed: 0 mph
TID: 0.84

## 2014-06-15 NOTE — Telephone Encounter (Signed)
Discussed with patient. Plans to continue medical therapy

## 2014-06-22 DIAGNOSIS — M25562 Pain in left knee: Secondary | ICD-10-CM | POA: Diagnosis not present

## 2014-06-22 DIAGNOSIS — M545 Low back pain: Secondary | ICD-10-CM | POA: Diagnosis not present

## 2014-06-26 ENCOUNTER — Other Ambulatory Visit: Payer: Self-pay | Admitting: Cardiovascular Disease

## 2014-06-27 DIAGNOSIS — M545 Low back pain: Secondary | ICD-10-CM | POA: Diagnosis not present

## 2014-06-27 DIAGNOSIS — M25562 Pain in left knee: Secondary | ICD-10-CM | POA: Diagnosis not present

## 2014-07-05 DIAGNOSIS — M7062 Trochanteric bursitis, left hip: Secondary | ICD-10-CM | POA: Diagnosis not present

## 2014-07-11 DIAGNOSIS — I10 Essential (primary) hypertension: Secondary | ICD-10-CM | POA: Diagnosis not present

## 2014-07-11 DIAGNOSIS — I2510 Atherosclerotic heart disease of native coronary artery without angina pectoris: Secondary | ICD-10-CM | POA: Diagnosis not present

## 2014-08-03 DIAGNOSIS — M545 Low back pain: Secondary | ICD-10-CM | POA: Diagnosis not present

## 2014-08-03 DIAGNOSIS — M25562 Pain in left knee: Secondary | ICD-10-CM | POA: Diagnosis not present

## 2014-08-21 DIAGNOSIS — M545 Low back pain: Secondary | ICD-10-CM | POA: Diagnosis not present

## 2014-08-21 DIAGNOSIS — M7062 Trochanteric bursitis, left hip: Secondary | ICD-10-CM | POA: Diagnosis not present

## 2014-08-30 DIAGNOSIS — L821 Other seborrheic keratosis: Secondary | ICD-10-CM | POA: Diagnosis not present

## 2014-08-30 DIAGNOSIS — D239 Other benign neoplasm of skin, unspecified: Secondary | ICD-10-CM | POA: Diagnosis not present

## 2014-08-30 DIAGNOSIS — L570 Actinic keratosis: Secondary | ICD-10-CM | POA: Diagnosis not present

## 2014-08-31 DIAGNOSIS — M545 Low back pain: Secondary | ICD-10-CM | POA: Diagnosis not present

## 2014-09-19 DIAGNOSIS — M545 Low back pain: Secondary | ICD-10-CM | POA: Diagnosis not present

## 2014-09-19 DIAGNOSIS — M7062 Trochanteric bursitis, left hip: Secondary | ICD-10-CM | POA: Diagnosis not present

## 2014-10-05 DIAGNOSIS — M7062 Trochanteric bursitis, left hip: Secondary | ICD-10-CM | POA: Diagnosis not present

## 2014-10-19 DIAGNOSIS — Z23 Encounter for immunization: Secondary | ICD-10-CM | POA: Diagnosis not present

## 2014-10-19 DIAGNOSIS — M5432 Sciatica, left side: Secondary | ICD-10-CM | POA: Diagnosis not present

## 2014-10-19 DIAGNOSIS — M7062 Trochanteric bursitis, left hip: Secondary | ICD-10-CM | POA: Diagnosis not present

## 2014-10-30 DIAGNOSIS — M7062 Trochanteric bursitis, left hip: Secondary | ICD-10-CM | POA: Diagnosis not present

## 2014-10-30 DIAGNOSIS — M5416 Radiculopathy, lumbar region: Secondary | ICD-10-CM | POA: Diagnosis not present

## 2014-10-30 DIAGNOSIS — M545 Low back pain: Secondary | ICD-10-CM | POA: Diagnosis not present

## 2014-11-13 DIAGNOSIS — M545 Low back pain: Secondary | ICD-10-CM | POA: Diagnosis not present

## 2014-11-13 DIAGNOSIS — L570 Actinic keratosis: Secondary | ICD-10-CM | POA: Diagnosis not present

## 2014-11-13 DIAGNOSIS — M5416 Radiculopathy, lumbar region: Secondary | ICD-10-CM | POA: Diagnosis not present

## 2014-11-27 DIAGNOSIS — M5416 Radiculopathy, lumbar region: Secondary | ICD-10-CM | POA: Diagnosis not present

## 2014-11-27 DIAGNOSIS — M545 Low back pain: Secondary | ICD-10-CM | POA: Diagnosis not present

## 2014-12-11 DIAGNOSIS — M5432 Sciatica, left side: Secondary | ICD-10-CM | POA: Diagnosis not present

## 2014-12-18 DIAGNOSIS — D239 Other benign neoplasm of skin, unspecified: Secondary | ICD-10-CM | POA: Diagnosis not present

## 2014-12-18 DIAGNOSIS — L821 Other seborrheic keratosis: Secondary | ICD-10-CM | POA: Diagnosis not present

## 2014-12-20 DIAGNOSIS — H1131 Conjunctival hemorrhage, right eye: Secondary | ICD-10-CM | POA: Diagnosis not present

## 2014-12-20 DIAGNOSIS — H2513 Age-related nuclear cataract, bilateral: Secondary | ICD-10-CM | POA: Diagnosis not present

## 2015-02-05 DIAGNOSIS — M1712 Unilateral primary osteoarthritis, left knee: Secondary | ICD-10-CM | POA: Diagnosis not present

## 2015-03-06 ENCOUNTER — Other Ambulatory Visit: Payer: Self-pay | Admitting: Dermatology

## 2015-03-06 DIAGNOSIS — L570 Actinic keratosis: Secondary | ICD-10-CM | POA: Diagnosis not present

## 2015-03-06 DIAGNOSIS — L820 Inflamed seborrheic keratosis: Secondary | ICD-10-CM | POA: Diagnosis not present

## 2015-03-06 DIAGNOSIS — D485 Neoplasm of uncertain behavior of skin: Secondary | ICD-10-CM | POA: Diagnosis not present

## 2015-03-12 DIAGNOSIS — M1712 Unilateral primary osteoarthritis, left knee: Secondary | ICD-10-CM | POA: Diagnosis not present

## 2015-04-09 DIAGNOSIS — M1712 Unilateral primary osteoarthritis, left knee: Secondary | ICD-10-CM | POA: Diagnosis not present

## 2015-04-13 DIAGNOSIS — R1030 Lower abdominal pain, unspecified: Secondary | ICD-10-CM | POA: Diagnosis not present

## 2015-04-23 ENCOUNTER — Other Ambulatory Visit: Payer: Self-pay

## 2015-04-23 MED ORDER — ENALAPRIL MALEATE 2.5 MG PO TABS: 2.5000 mg | ORAL_TABLET | Freq: Every day | ORAL | Status: DC

## 2015-04-23 NOTE — Telephone Encounter (Signed)
Recall letter scheduled for 06/12/15

## 2015-05-11 DIAGNOSIS — M1712 Unilateral primary osteoarthritis, left knee: Secondary | ICD-10-CM | POA: Diagnosis not present

## 2015-05-11 DIAGNOSIS — E7800 Pure hypercholesterolemia, unspecified: Secondary | ICD-10-CM | POA: Diagnosis not present

## 2015-05-11 DIAGNOSIS — Z0000 Encounter for general adult medical examination without abnormal findings: Secondary | ICD-10-CM | POA: Diagnosis not present

## 2015-05-11 DIAGNOSIS — K5909 Other constipation: Secondary | ICD-10-CM | POA: Diagnosis not present

## 2015-05-11 DIAGNOSIS — Z1389 Encounter for screening for other disorder: Secondary | ICD-10-CM | POA: Diagnosis not present

## 2015-05-24 DIAGNOSIS — M1712 Unilateral primary osteoarthritis, left knee: Secondary | ICD-10-CM | POA: Diagnosis not present

## 2015-05-29 DIAGNOSIS — M1712 Unilateral primary osteoarthritis, left knee: Secondary | ICD-10-CM | POA: Diagnosis not present

## 2015-06-05 DIAGNOSIS — H04123 Dry eye syndrome of bilateral lacrimal glands: Secondary | ICD-10-CM | POA: Diagnosis not present

## 2015-06-05 DIAGNOSIS — H2513 Age-related nuclear cataract, bilateral: Secondary | ICD-10-CM | POA: Diagnosis not present

## 2015-06-05 DIAGNOSIS — H43811 Vitreous degeneration, right eye: Secondary | ICD-10-CM | POA: Diagnosis not present

## 2015-06-14 ENCOUNTER — Ambulatory Visit: Payer: Self-pay | Admitting: Orthopedic Surgery

## 2015-06-14 NOTE — Progress Notes (Signed)
Preoperative surgical orders have been place into the Epic hospital system for Zachary Barron on 06/14/2015, 10:48 AM  by Mickel Crow for surgery on 07-04-15.  Preop Knee Scope orders including IV Tylenol and IV Decadron as long as there are no contraindications to the above medications. Arlee Muslim, PA-C

## 2015-06-22 ENCOUNTER — Other Ambulatory Visit: Payer: Self-pay

## 2015-06-22 MED ORDER — SIMVASTATIN 40 MG PO TABS: 40.0000 mg | ORAL_TABLET | Freq: Every day | ORAL | Status: DC

## 2015-06-26 NOTE — Patient Instructions (Addendum)
Zachary Barron  06/26/2015   Your procedure is scheduled on: 07/04/2015   Report to [facility]  Entrance take [place]  elevators to 3rd floor to  [place] at    1130 AM.  Call this number if you have problems the morning of surgery [phone]   Remember: ONLY 1 PERSON MAY GO WITH YOU TO SHORT STAY TO GET  READY MORNING OF Zachary Barron.none   Do not eat food or drink liquids :After Midnight.     Take these medicines the morning of surgery with A SIP OF WATER: none                                 You may not have any metal on your body including hair pins and              piercings  Do not wear jewelry, , lotions, powders or perfumes, deodorant                       Men may shave face and neck.   Do not bring valuables to the hospital. Zachary Barron.  Contacts, dentures or bridgework may not be worn into surgery.       Patients discharged the day of surgery will not be allowed to drive home.  Name and phone number of your driver:  Special Instructions: coughing and deep breathing exercises, leg exercises               Please read over the following fact sheets you were given: _____________________________________________________________________             [facility] - Preparing for Surgery Before surgery, you can play an important role.  Because skin is not sterile, your skin needs to be as free of germs as possible.  You can reduce the number of germs on your skin by washing with CHG (chlorahexidine gluconate) soap before surgery.  CHG is an antiseptic cleaner which kills germs and bonds with the skin to continue killing germs even after washing. Please DO NOT use if you have an allergy to CHG or antibacterial soaps.  If your skin becomes reddened/irritated stop using the CHG and inform your nurse when you arrive at Short Stay. Do not shave (including legs and underarms) for at least 48 hours  prior to the first CHG shower.  You may shave your face/neck. Please follow these instructions carefully:  1.  Shower with CHG Soap the night before surgery and the  morning of Surgery.  2.  If you choose to wash your hair, wash your hair first as usual with your  normal  shampoo.  3.  After you shampoo, rinse your hair and body thoroughly to remove the  shampoo.                           4.  Use CHG as you would any other liquid soap.  You can apply chg directly  to the skin and wash                       Gently with a scrungie or clean washcloth.  5.  Apply the CHG Soap to your body ONLY FROM THE NECK DOWN.   Do not use on face/ open                           Wound or open sores. Avoid contact with eyes, ears mouth and genitals (private parts).                       Wash face,  Genitals (private parts) with your normal soap.             6.  Wash thoroughly, paying special attention to the area where your surgery  will be performed.  7.  Thoroughly rinse your body with warm water from the neck down.  8.  DO NOT shower/wash with your normal soap after using and rinsing off  the CHG Soap.                9.  Pat yourself dry with a clean towel.            10.  Wear clean pajamas.            11.  Place clean sheets on your bed the night of your first shower and do not  sleep with pets. Day of Surgery : Do not apply any lotions/deodorants the morning of surgery.  Please wear clean clothes to the hospital/surgery center.  FAILURE TO FOLLOW THESE INSTRUCTIONS MAY RESULT IN THE CANCELLATION OF YOUR SURGERY PATIENT SIGNATURE_________________________________  NURSE SIGNATURE__________________________________  ________________________________________________________________________   Zachary Barron  An incentive spirometer is a tool that can help keep your lungs clear and active. This tool measures how well you are filling your lungs with each breath. Taking long deep breaths may help reverse  or decrease the chance of developing breathing (pulmonary) problems (especially infection) following:  A long period of time when you are unable to move or be active. BEFORE THE PROCEDURE   If the spirometer includes an indicator to show your best effort, your nurse or respiratory therapist will set it to a desired goal.  If possible, sit up straight or lean slightly forward. Try not to slouch.  Hold the incentive spirometer in an upright position. INSTRUCTIONS FOR USE  1. Sit on the edge of your bed if possible, or sit up as far as you can in bed or on a chair. 2. Hold the incentive spirometer in an upright position. 3. Breathe out normally. 4. Place the mouthpiece in your mouth and seal your lips tightly around it. 5. Breathe in slowly and as deeply as possible, raising the piston or the ball toward the top of the column. 6. Hold your breath for 3-5 seconds or for as long as possible. Allow the piston or ball to fall to the bottom of the column. 7. Remove the mouthpiece from your mouth and breathe out normally. 8. Rest for a few seconds and repeat Steps 1 through 7 at least 10 times every 1-2 hours when you are awake. Take your time and take a few normal breaths between deep breaths. 9. The spirometer may include an indicator to show your best effort. Use the indicator as a goal to work toward during each repetition. 10. After each set of 10 deep breaths, practice coughing to be sure your lungs are clear. If you have an incision (the cut made at the time of surgery), support your incision when coughing by placing a  pillow or rolled up towels firmly against it. Once you are able to get out of bed, walk around indoors and cough well. You may stop using the incentive spirometer when instructed by your caregiver.  RISKS AND COMPLICATIONS  Take your time so you do not get dizzy or light-headed.  If you are in pain, you may need to take or ask for pain medication before doing incentive  spirometry. It is harder to take a deep breath if you are having pain. AFTER USE  Rest and breathe slowly and easily.  It can be helpful to keep track of a log of your progress. Your caregiver can provide you with a simple table to help with this. If you are using the spirometer at home, follow these instructions: [place] IF:   You are having difficultly using the spirometer.  You have trouble using the spirometer as often as instructed.  Your pain medication is not giving enough relief while using the spirometer.  You develop fever of 100.5 F (38.1 C) or higher. SEEK IMMEDIATE MEDICAL CARE IF:   You cough up bloody sputum that had not been present before.  You develop fever of 102 F (38.9 C) or greater.  You develop worsening pain at or near the incision site. MAKE SURE YOU:   Understand these instructions.  Will watch your condition.  Will get help right away if you are not doing well or get worse. Document Released: 06/09/2006 Document Revised: 04/21/2011 Document Reviewed: 08/10/2006 [facility] Patient Information 2014 [place], [place].   ________________________________________________________________________

## 2015-06-28 ENCOUNTER — Encounter (HOSPITAL_COMMUNITY)
Admission: RE | Admit: 2015-06-28 | Discharge: 2015-06-28 | Disposition: A | Discharge status: Home / Self Care | Payer: Medicare Other | Source: Ambulatory Visit | Attending: Orthopedic Surgery | Admitting: Orthopedic Surgery

## 2015-06-28 ENCOUNTER — Encounter (HOSPITAL_COMMUNITY): Payer: Self-pay

## 2015-06-28 DIAGNOSIS — R9431 Abnormal electrocardiogram [ECG] [EKG]: Secondary | ICD-10-CM | POA: Diagnosis not present

## 2015-06-28 DIAGNOSIS — X58XXXA Exposure to other specified factors, initial encounter: Secondary | ICD-10-CM | POA: Insufficient documentation

## 2015-06-28 DIAGNOSIS — S83242A Other tear of medial meniscus, current injury, left knee, initial encounter: Secondary | ICD-10-CM | POA: Diagnosis not present

## 2015-06-28 DIAGNOSIS — Z01812 Encounter for preprocedural laboratory examination: Secondary | ICD-10-CM | POA: Insufficient documentation

## 2015-06-28 DIAGNOSIS — Z01818 Encounter for other preprocedural examination: Secondary | ICD-10-CM | POA: Diagnosis not present

## 2015-06-28 HISTORY — DX: Gastro-esophageal reflux disease without esophagitis: K21.9

## 2015-06-28 HISTORY — DX: Unspecified osteoarthritis, unspecified site: M19.90

## 2015-06-28 LAB — CBC
HEMATOCRIT: 39.5 % (ref 39.0–52.0)
HEMOGLOBIN: 13.4 g/dL (ref 13.0–17.0)
MCH: 31 pg (ref 26.0–34.0)
MCHC: 33.9 g/dL (ref 30.0–36.0)
MCV: 91.4 fL (ref 78.0–100.0)
Platelets: 253 10*3/uL (ref 150–400)
RBC: 4.32 MIL/uL (ref 4.22–5.81)
RDW: 13.1 % (ref 11.5–15.5)
WBC: 5.9 10*3/uL (ref 4.0–10.5)

## 2015-06-28 LAB — BASIC METABOLIC PANEL
Anion gap: 8 (ref 5–15)
BUN: 19 mg/dL (ref 6–20)
CALCIUM: 9.2 mg/dL (ref 8.9–10.3)
CO2: 25 mmol/L (ref 22–32)
CREATININE: 0.98 mg/dL (ref 0.61–1.24)
Chloride: 105 mmol/L (ref 101–111)
GFR calc non Af Amer: >60 mL/min (ref >60)
GLUCOSE: 95 mg/dL (ref 65–99)
Potassium: 4.2 mmol/L (ref 3.5–5.1)
Sodium: 138 mmol/L (ref 135–145)

## 2015-06-28 LAB — SURGICAL PCR SCREEN
MRSA, PCR: NEGATIVE
STAPHYLOCOCCUS AUREUS: NEGATIVE

## 2015-06-28 NOTE — Progress Notes (Signed)
Final EKG done 06/28/15- EPIC

## 2015-06-28 NOTE — Progress Notes (Addendum)
Stress- 06/12/14- EPIC  06/01/2014- LOV- Cardiology - EPIC  LOV- 07/11/14- Cardiology at [facility]- Dr Posey Pronto- EPIC

## 2015-07-04 ENCOUNTER — Encounter (HOSPITAL_COMMUNITY): Payer: Self-pay | Admitting: *Deleted

## 2015-07-04 ENCOUNTER — Ambulatory Visit (HOSPITAL_COMMUNITY): Payer: Medicare Other | Admitting: Certified Registered Nurse Anesthetist

## 2015-07-04 ENCOUNTER — Ambulatory Visit (HOSPITAL_COMMUNITY)
Admission: RE | Admit: 2015-07-04 | Discharge: 2015-07-04 | Disposition: A | Discharge status: Home / Self Care | Payer: Medicare Other | Source: Ambulatory Visit | Attending: Orthopedic Surgery | Admitting: Orthopedic Surgery

## 2015-07-04 ENCOUNTER — Encounter (HOSPITAL_COMMUNITY)
Admission: RE | Disposition: A | Discharge status: Home / Self Care | Payer: Self-pay | Source: Ambulatory Visit | Attending: Orthopedic Surgery

## 2015-07-04 DIAGNOSIS — Z79899 Other long term (current) drug therapy: Secondary | ICD-10-CM | POA: Insufficient documentation

## 2015-07-04 DIAGNOSIS — Z7982 Long term (current) use of aspirin: Secondary | ICD-10-CM | POA: Diagnosis not present

## 2015-07-04 DIAGNOSIS — Z951 Presence of aortocoronary bypass graft: Secondary | ICD-10-CM | POA: Diagnosis not present

## 2015-07-04 DIAGNOSIS — I2510 Atherosclerotic heart disease of native coronary artery without angina pectoris: Secondary | ICD-10-CM | POA: Insufficient documentation

## 2015-07-04 DIAGNOSIS — M23204 Derangement of unspecified medial meniscus due to old tear or injury, left knee: Secondary | ICD-10-CM | POA: Insufficient documentation

## 2015-07-04 DIAGNOSIS — E785 Hyperlipidemia, unspecified: Secondary | ICD-10-CM | POA: Insufficient documentation

## 2015-07-04 DIAGNOSIS — Z87891 Personal history of nicotine dependence: Secondary | ICD-10-CM | POA: Insufficient documentation

## 2015-07-04 DIAGNOSIS — I252 Old myocardial infarction: Secondary | ICD-10-CM | POA: Insufficient documentation

## 2015-07-04 DIAGNOSIS — M23322 Other meniscus derangements, posterior horn of medial meniscus, left knee: Secondary | ICD-10-CM | POA: Diagnosis not present

## 2015-07-04 DIAGNOSIS — S83249A Other tear of medial meniscus, current injury, unspecified knee, initial encounter: Secondary | ICD-10-CM | POA: Diagnosis present

## 2015-07-04 DIAGNOSIS — S83242A Other tear of medial meniscus, current injury, left knee, initial encounter: Secondary | ICD-10-CM | POA: Diagnosis not present

## 2015-07-04 DIAGNOSIS — Z955 Presence of coronary angioplasty implant and graft: Secondary | ICD-10-CM | POA: Insufficient documentation

## 2015-07-04 HISTORY — PX: KNEE ARTHROSCOPY: SHX127

## 2015-07-04 SURGERY — ARTHROSCOPY, KNEE
Anesthesia: General | Site: Knee | Laterality: Left

## 2015-07-04 MED ORDER — CHLORHEXIDINE GLUCONATE 4 % EX LIQD: 60.0000 mL | Freq: Once | CUTANEOUS | Status: DC

## 2015-07-04 MED ORDER — DEXAMETHASONE SODIUM PHOSPHATE 10 MG/ML IJ SOLN: 10.0000 mg | Freq: Once | INTRAMUSCULAR | Status: DC

## 2015-07-04 MED ORDER — CEFAZOLIN SODIUM-DEXTROSE 2-4 GM/100ML-% IV SOLN
2.0000 g | INTRAVENOUS | Status: AC
  Administered 2015-07-04: 2 g via INTRAVENOUS
  Filled 2015-07-04: qty 100

## 2015-07-04 MED ORDER — DEXAMETHASONE SODIUM PHOSPHATE 4 MG/ML IJ SOLN
INTRAMUSCULAR | Status: DC | PRN
  Administered 2015-07-04: 10 mg via INTRAVENOUS

## 2015-07-04 MED ORDER — EPHEDRINE SULFATE 50 MG/ML IJ SOLN
INTRAMUSCULAR | Status: DC | PRN
  Administered 2015-07-04: 5 mg via INTRAVENOUS

## 2015-07-04 MED ORDER — LACTATED RINGERS IR SOLN
Status: DC | PRN
  Administered 2015-07-04: 6000 mL

## 2015-07-04 MED ORDER — NITROGLYCERIN 0.4 MG SL SUBL: 0.4000 mg | SUBLINGUAL_TABLET | SUBLINGUAL | Status: DC | PRN

## 2015-07-04 MED ORDER — LIDOCAINE HCL (CARDIAC) 20 MG/ML IV SOLN
INTRAVENOUS | Status: DC | PRN
  Administered 2015-07-04: 50 mg via INTRAVENOUS

## 2015-07-04 MED ORDER — PROPOFOL 10 MG/ML IV BOLUS
INTRAVENOUS | Status: AC
  Filled 2015-07-04: qty 20

## 2015-07-04 MED ORDER — SODIUM CHLORIDE 0.9 % IV SOLN: INTRAVENOUS | Status: DC

## 2015-07-04 MED ORDER — ACETAMINOPHEN 10 MG/ML IV SOLN
INTRAVENOUS | Status: AC
  Filled 2015-07-04: qty 100

## 2015-07-04 MED ORDER — CEFAZOLIN SODIUM-DEXTROSE 2-4 GM/100ML-% IV SOLN
INTRAVENOUS | Status: AC
  Filled 2015-07-04: qty 100

## 2015-07-04 MED ORDER — FENTANYL CITRATE (PF) 100 MCG/2ML IJ SOLN
INTRAMUSCULAR | Status: AC
  Filled 2015-07-04: qty 2

## 2015-07-04 MED ORDER — LACTATED RINGERS IV SOLN
INTRAVENOUS | Status: DC | PRN
  Administered 2015-07-04: 13:00:00 via INTRAVENOUS

## 2015-07-04 MED ORDER — FENTANYL CITRATE (PF) 100 MCG/2ML IJ SOLN
INTRAMUSCULAR | Status: DC | PRN
  Administered 2015-07-04 (×4): 25 ug via INTRAVENOUS

## 2015-07-04 MED ORDER — DEXAMETHASONE SODIUM PHOSPHATE 10 MG/ML IJ SOLN
INTRAMUSCULAR | Status: AC
  Filled 2015-07-04: qty 1

## 2015-07-04 MED ORDER — ONDANSETRON HCL 4 MG/2ML IJ SOLN
INTRAMUSCULAR | Status: DC | PRN
  Administered 2015-07-04: 4 mg via INTRAVENOUS

## 2015-07-04 MED ORDER — GLYCOPYRROLATE 0.2 MG/ML IJ SOLN
INTRAMUSCULAR | Status: DC | PRN
  Administered 2015-07-04 (×2): 0.1 mg via INTRAVENOUS

## 2015-07-04 MED ORDER — HYDROCODONE-ACETAMINOPHEN 5-325 MG PO TABS
1.0000 | ORAL_TABLET | Freq: Once | ORAL | Status: AC
  Administered 2015-07-04: 1 via ORAL
  Filled 2015-07-04: qty 1

## 2015-07-04 MED ORDER — SODIUM CHLORIDE 0.9 % IJ SOLN
INTRAMUSCULAR | Status: AC
  Filled 2015-07-04: qty 10

## 2015-07-04 MED ORDER — POVIDONE-IODINE 10 % EX SWAB: 2.0000 "application " | Freq: Once | CUTANEOUS | Status: DC

## 2015-07-04 MED ORDER — HYDROCODONE-ACETAMINOPHEN 5-325 MG PO TABS: 1.0000 | ORAL_TABLET | ORAL | Status: DC | PRN

## 2015-07-04 MED ORDER — LIDOCAINE HCL (CARDIAC) 20 MG/ML IV SOLN
INTRAVENOUS | Status: AC
  Filled 2015-07-04: qty 5

## 2015-07-04 MED ORDER — METHOCARBAMOL 500 MG PO TABS: 500.0000 mg | ORAL_TABLET | Freq: Four times a day (QID) | ORAL | Status: DC

## 2015-07-04 MED ORDER — BUPIVACAINE-EPINEPHRINE 0.25% -1:200000 IJ SOLN
INTRAMUSCULAR | Status: DC | PRN
  Administered 2015-07-04: 30 mL

## 2015-07-04 MED ORDER — PROCHLORPERAZINE EDISYLATE 5 MG/ML IJ SOLN: 10.0000 mg | Freq: Once | INTRAMUSCULAR | Status: DC | PRN

## 2015-07-04 MED ORDER — ONDANSETRON HCL 4 MG/2ML IJ SOLN
INTRAMUSCULAR | Status: AC
  Filled 2015-07-04: qty 2

## 2015-07-04 MED ORDER — ACETAMINOPHEN 10 MG/ML IV SOLN
1000.0000 mg | Freq: Once | INTRAVENOUS | Status: AC
  Administered 2015-07-04: 1000 mg via INTRAVENOUS
  Filled 2015-07-04: qty 100

## 2015-07-04 MED ORDER — BUPIVACAINE-EPINEPHRINE (PF) 0.25% -1:200000 IJ SOLN
INTRAMUSCULAR | Status: AC
  Filled 2015-07-04: qty 30

## 2015-07-04 MED ORDER — PROPOFOL 10 MG/ML IV BOLUS
INTRAVENOUS | Status: DC | PRN
  Administered 2015-07-04: 200 mg via INTRAVENOUS

## 2015-07-04 MED ORDER — EPHEDRINE SULFATE 50 MG/ML IJ SOLN
INTRAMUSCULAR | Status: AC
  Filled 2015-07-04: qty 1

## 2015-07-04 MED ORDER — FENTANYL CITRATE (PF) 100 MCG/2ML IJ SOLN
25.0000 ug | INTRAMUSCULAR | Status: DC | PRN
  Administered 2015-07-04 (×2): 50 ug via INTRAVENOUS

## 2015-07-04 SURGICAL SUPPLY — 31 items
BANDAGE ACE 6X5 VEL STRL LF (GAUZE/BANDAGES/DRESSINGS) ×2 IMPLANT
BLADE 4.2CUDA (BLADE) ×2 IMPLANT
COVER MAYO STAND STRL (DRAPES) ×2 IMPLANT
COVER SURGICAL LIGHT HANDLE (MISCELLANEOUS) ×2 IMPLANT
CUFF TOURN SGL QUICK 34 (TOURNIQUET CUFF) ×1
CUFF TRNQT CYL 34X4X40X1 (TOURNIQUET CUFF) ×1 IMPLANT
DRAPE U-SHAPE 47X51 STRL (DRAPES) ×2 IMPLANT
DRSG EMULSION OIL 3X3 NADH (GAUZE/BANDAGES/DRESSINGS) ×2 IMPLANT
DRSG PAD ABDOMINAL 8X10 ST (GAUZE/BANDAGES/DRESSINGS) ×2 IMPLANT
DURAPREP 26ML APPLICATOR (WOUND CARE) ×2 IMPLANT
GAUZE SPONGE 4X4 12PLY STRL (GAUZE/BANDAGES/DRESSINGS) ×2 IMPLANT
GLOVE BIO SURGEON STRL SZ8 (GLOVE) ×2 IMPLANT
GLOVE BIOGEL PI IND STRL 7.0 (GLOVE) ×2 IMPLANT
GLOVE BIOGEL PI IND STRL 8 (GLOVE) ×1 IMPLANT
GLOVE BIOGEL PI INDICATOR 7.0 (GLOVE) ×2
GLOVE BIOGEL PI INDICATOR 8 (GLOVE) ×1
GOWN STRL REUS W/TWL LRG LVL3 (GOWN DISPOSABLE) ×2 IMPLANT
GOWN STRL REUS W/TWL XL LVL3 (GOWN DISPOSABLE) ×2 IMPLANT
KIT BASIN OR (CUSTOM PROCEDURE TRAY) ×2 IMPLANT
MANIFOLD NEPTUNE II (INSTRUMENTS) ×2 IMPLANT
MARKER SKIN DUAL TIP RULER LAB (MISCELLANEOUS) ×2 IMPLANT
PACK ARTHROSCOPY WL (CUSTOM PROCEDURE TRAY) ×2 IMPLANT
PACK ICE MAXI GEL EZY WRAP (MISCELLANEOUS) ×6 IMPLANT
PAD ABD 8X10 STRL (GAUZE/BANDAGES/DRESSINGS) ×2 IMPLANT
PADDING CAST COTTON 6X4 STRL (CAST SUPPLIES) ×2 IMPLANT
POSITIONER SURGICAL ARM (MISCELLANEOUS) ×2 IMPLANT
SUT ETHILON 4 0 PS 2 18 (SUTURE) ×2 IMPLANT
TOWEL OR 17X26 10 PK STRL BLUE (TOWEL DISPOSABLE) ×2 IMPLANT
TUBING ARTHRO INFLOW-ONLY STRL (TUBING) ×2 IMPLANT
WAND HAND CNTRL MULTIVAC 90 (MISCELLANEOUS) IMPLANT
WRAP KNEE MAXI GEL POST OP (GAUZE/BANDAGES/DRESSINGS) ×2 IMPLANT

## 2015-07-04 NOTE — Op Note (Signed)
Preoperative diagnosis-  Left knee medial meniscal tear  Postoperative diagnosis Left- knee medial meniscal tear  Procedure- Left knee arthroscopy with medial  meniscal debridement    Surgeon- Dione Plover. Espen Bethel, MD  Anesthesia-General  EBL-  Minimal  Complications- None  Condition- PACU - hemodynamically stable.  Brief clinical note- -Zachary Barron is a 75 y.o.  male with a several month history of left knee pain and mechanical symptoms. Exam and history suggested medial meniscal tear confirmed by MRI. The patient presents now for arthroscopy and debridement   Procedure in detail -       After successful administration of General anesthetic, a tourmiquet is placed high on the Left  thigh and the Left lower extremity is prepped and draped in the usual sterile fashion. Time out is performed by the surgical team. Standard superomedial and inferolateral portal sites are marked and incisions made with an 11 blade. The inflow cannula is passed through the superomedial portal and camera through the inferolateral portal and inflow is initiated. Arthroscopic visualization proceeds.      The undersurface of the patella and trochlea are visualized and they are normal. The medial and lateral gutters are visualized and there are  no loose bodies. Flexion and valgus force is applied to the knee and the medial compartment is entered. A spinal needle is passed into the joint through the site marked for the inferomedial portal. A small incision is made and the dilator passed into the joint. The findings for the medial compartment are unstable degenerative tear of the medial meniscus. The chondral surfaces are normal. . The tear is debrided to a stable base with baskets and a shaver and sealed off with the Arthrocare. It is probed and found to be stable.    The intercondylar notch is visualized and the ACL appears normal . The lateral compartment is entered and the findings are normal .      The joint is again  inspected and there are no other tears, defects or loose bodies identified. The arthroscopic equipment is then removed from the inferior portals which are closed with interrupted 4-0 nylon. 20 ml of .25% Marcaine with epinephrine are injected through the inflow cannula and the cannula is then removed and the portal closed with nylon. The incisions are cleaned and dried and a bulky sterile dressing is applied. The patient is then awakened and transported to recovery in stable condition.   07/04/2015, 2:49 PM

## 2015-07-04 NOTE — H&P (Signed)
  CC- Daunte Mohammad is a 75 y.o. male who presents with left knee pain.  HPI- . Knee Pain: Patient presents with knee pain involving the  left knee. Onset of the symptoms was several months ago. Inciting event: none known. Current symptoms include giving out, pain located medially and stiffness. Pain is aggravated by lateral movements, pivoting, rising after sitting and walking.  Patient has had no prior knee problems. Evaluation to date: MRI: abnormal medial meniscal tear. Treatment to date: corticosteroid injection which was not very effective.  Past Medical History  Diagnosis Date  . MI (myocardial infarction) ([place])   . Stented coronary artery   . Hyperlipemia   . LBP (low back pain)   . Fibroma   . GERD (gastroesophageal reflux disease)   . Arthritis     Past Surgical History  Procedure Laterality Date  . Coronary stent placement    . Left rotator cuff repair     . Left foot surgery    . Bilateral duproyn's contracture surgery       Prior to Admission medications   Medication Sig Start Date End Date Taking? Authorizing Provider  acetaminophen (TYLENOL) 500 MG tablet Take 1,000 mg by mouth every 6 (six) hours as needed (For pain.).    Yes Historical Provider, MD  aspirin EC 81 MG tablet Take 81 mg by mouth daily.   Yes Historical Provider, MD  Cholecalciferol (VITAMIN D3) 1000 UNITS CAPS Take 1,000 Units by mouth daily.    Yes Historical Provider, MD  diclofenac sodium (VOLTAREN) 1 % GEL Apply 2 g topically 4 (four) times daily as needed (For pain.).  10/23/09  Yes Historical Provider, MD  enalapril (VASOTEC) 2.5 MG tablet Take 1 tablet (2.5 mg total) by mouth daily. 04/23/15  Yes Sherren Mocha, MD  nitroGLYCERIN (NITROSTAT) 0.4 MG SL tablet Place 1 tablet (0.4 mg total) under the tongue every 5 (five) minutes as needed. Patient taking differently: Place 0.4 mg under the tongue every 5 (five) minutes as needed for chest pain.  03/18/11  Yes Sherren Mocha, MD  OVER THE COUNTER  MEDICATION Apply 1 application topically daily as needed (For pain.). Deep Blue Rub   Yes Historical Provider, MD  sertraline (ZOLOFT) 25 MG tablet Take 25 mg by mouth at bedtime.    Yes Historical Provider, MD  simvastatin (ZOCOR) 40 MG tablet Take 1 tablet (40 mg total) by mouth at bedtime. 06/22/15   Sherren Mocha, MD   KNEE EXAM antalgic gait, soft tissue tenderness over medial joint line, no effusion, negative drawer sign, collateral ligaments intact  Physical Examination: General appearance - alert, well appearing, and in no distress Mental status - alert, oriented to person, place, and time Chest - clear to auscultation, no wheezes, rales or rhonchi, symmetric air entry Heart - normal rate, regular rhythm, normal S1, S2, no murmurs, rubs, clicks or gallops Abdomen - soft, nontender, nondistended, no masses or organomegaly Neurological - alert, oriented, normal speech, no focal findings or movement disorder noted    Asessment/Plan--- Left knee medial meniscal tear- - Plan left knee arthroscopy with meniscal debridement. Procedure risks and potential comps discussed with patient who elects to proceed. Goals are decreased pain and increased function with a high likelihood of achieving both

## 2015-07-04 NOTE — Anesthesia Preprocedure Evaluation (Addendum)
Anesthesia Evaluation  Patient identified by MRN, date of birth, ID band Patient awake    Reviewed: Allergy & Precautions, NPO status , Patient's Chart, lab work & pertinent test results  Airway Mallampati: II  TM Distance: >3 FB Neck ROM: Full    Dental no notable dental hx.    Pulmonary former smoker,    Pulmonary exam normal breath sounds clear to auscultation       Cardiovascular + CAD, + Past MI and + Cardiac Stents  Normal cardiovascular exam Rhythm:Regular Rate:Normal     Neuro/Psych negative neurological ROS  negative psych ROS   GI/Hepatic Neg liver ROS, GERD  ,  Endo/Other  negative endocrine ROS  Renal/GU negative Renal ROS  negative genitourinary   Musculoskeletal  (+) Arthritis ,   Abdominal   Peds negative pediatric ROS (+)  Hematology negative hematology ROS (+)   Anesthesia Other Findings   Reproductive/Obstetrics negative OB ROS                             Anesthesia Physical Anesthesia Plan  ASA: III  Anesthesia Plan: General   Post-op Pain Management:    Induction: Intravenous  Airway Management Planned: LMA  Additional Equipment:   Intra-op Plan:   Post-operative Plan: Extubation in OR  Informed Consent: I have reviewed the patients History and Physical, chart, labs and discussed the procedure including the risks, benefits and alternatives for the proposed anesthesia with the patient or authorized representative who has indicated his/her understanding and acceptance.   Dental advisory given  Plan Discussed with: CRNA  Anesthesia Plan Comments:         Anesthesia Quick Evaluation

## 2015-07-04 NOTE — Interval H&P Note (Signed)
History and Physical Interval Note:  07/04/2015 2:02 PM  Zachary Barron  has presented today for surgery, with the diagnosis of left knee medial mensical tear  The various methods of treatment have been discussed with the patient and family. After consideration of risks, benefits and other options for treatment, the patient has consented to  Procedure(s): ARTHROSCOPY LEFT KNEE WITH MENSICAL DEBRIDEMENT (Left) as a surgical intervention .  The patient's history has been reviewed, patient examined, no change in status, stable for surgery.  I have reviewed the patient's chart and labs.  Questions were answered to the patient's satisfaction.     Gearlean Alf

## 2015-07-04 NOTE — Transfer of Care (Signed)
Immediate Anesthesia Transfer of Care Note  Patient: Zachary Barron  Procedure(s) Performed: Procedure(s): ARTHROSCOPY LEFT KNEE WITH MENSICAL DEBRIDEMENT (Left)  Patient Location: PACU  Anesthesia Type:General  Level of Consciousness: awake and alert   Airway & Oxygen Therapy: Patient Spontanous Breathing and Patient connected to face mask oxygen  Post-op Assessment: Report given to RN and Post -op Vital signs reviewed and stable  Post vital signs: Reviewed and stable  Last Vitals:  Filed Vitals:   07/04/15 1142  BP: 114/86  Pulse: 66  Temp: 36.6 C  Resp: 18    Last Pain:  Filed Vitals:   07/04/15 1207  PainSc: 4          Complications: No apparent anesthesia complications

## 2015-07-04 NOTE — Anesthesia Postprocedure Evaluation (Signed)
Anesthesia Post Note  Patient: Zachary Barron  Procedure(s) Performed: Procedure(s) (LRB): ARTHROSCOPY LEFT KNEE WITH MENSICAL DEBRIDEMENT (Left)  Patient location during evaluation: PACU Anesthesia Type: General Level of consciousness: awake and alert Pain management: pain level controlled Vital Signs Assessment: post-procedure vital signs reviewed and stable Respiratory status: spontaneous breathing, nonlabored ventilation, respiratory function stable and patient connected to nasal cannula oxygen Cardiovascular status: blood pressure returned to baseline and stable Postop Assessment: no signs of nausea or vomiting Anesthetic complications: no    Last Vitals:  Filed Vitals:   07/04/15 1558 07/04/15 1607  BP: 113/65 115/58  Pulse: 68 64  Temp: 36.5 C 36.6 C  Resp: 12 12    Last Pain:  Filed Vitals:   07/04/15 1715  PainSc: 2                  Zenaida Deed

## 2015-07-04 NOTE — Discharge Instructions (Signed)
° °Dr. Zehava Turski °Total Joint Specialist °[facility] °[address] °[place], [place] [address] °[phone] ° ° °Arthroscopic Procedure, Knee °An arthroscopic procedure can find what is wrong with your knee. °PROCEDURE °Arthroscopy is a surgical technique that allows your orthopedic surgeon to diagnose and treat your knee injury with accuracy. They will look into your knee through a small instrument. This is almost like a small (pencil sized) telescope. Because arthroscopy affects your knee less than open knee surgery, you can anticipate a more rapid recovery. Taking an active role by following your caregiver's instructions will help with rapid and complete recovery. Use crutches, rest, elevation, ice, and knee exercises as instructed. The length of recovery depends on various factors including type of injury, age, physical condition, medical conditions, and your rehabilitation. °Your knee is the joint between the large bones (femur and tibia) in your leg. Cartilage covers these bone ends which are smooth and slippery and allow your knee to bend and move smoothly. Two menisci, thick, semi-lunar shaped pads of cartilage which form a rim inside the joint, help absorb shock and stabilize your knee. Ligaments bind the bones together and support your knee joint. Muscles move the joint, help support your knee, and take stress off the joint itself. Because of this all programs and physical therapy to rehabilitate an injured or repaired knee require rebuilding and strengthening your muscles. °AFTER THE PROCEDURE °· After the procedure, you will be moved to a recovery area until most of the effects of the medication have worn off. Your caregiver will discuss the test results with you.  °· Only take over-the-counter or prescription medicines for pain, discomfort, or fever as directed by your caregiver.  °SEEK MEDICAL CARE IF:  °· You have increased bleeding from your wounds.  °· You see  redness, swelling, or have increasing pain in your wounds.  °· You have pus coming from your wound.  °· You have an oral temperature above 102° F (38.9° C).  °· You notice a bad smell coming from the wound or dressing.  °· You have severe pain with any motion of your knee.  °SEEK IMMEDIATE MEDICAL CARE IF:  °· You develop a rash.  °· You have difficulty breathing.  °· You have any allergic problems.  °FURTHER INSTRUCTIONS:  °· ICE to the affected knee every three hours for 30 minutes at a time and then as needed for pain and swelling.  Continue to use ice on the knee for pain and swelling from surgery. You may notice swelling that will progress down to the foot and ankle.  This is normal after surgery.  Elevate the leg when you are not up walking on it.   ° °DIET °You may resume your previous home diet once your are discharged from the hospital. ° °DRESSING / WOUND CARE / SHOWERING °You may start showering two days after being discharged home but do not submerge the incisions under water.  °Change dressing 48 hours after the procedure and then cover the small incisions with band aids until your follow up visit. °Change the surgical dressings daily and reapply a dry dressing each time.  ° °ACTIVITY °Walk with your walker as instructed. °Use walker as long as suggested by your caregivers. °Avoid periods of inactivity such as sitting longer than an hour when not asleep. This helps prevent blood clots.  °You may resume a sexual relationship in one month or when given the OK by your doctor.  °You may return to   work once you are cleared by your doctor.  °Do not drive a car for 6 weeks or until released by you surgeon.  °Do not drive while taking narcotics. ° °WEIGHT BEARING AS TOLERATED ° °POSTOPERATIVE CONSTIPATION PROTOCOL °Constipation - defined medically as fewer than three stools per week and severe constipation as less than one stool per week. ° °One of the most common issues patients have following surgery is  constipation.  Even if you have a regular bowel pattern at home, your normal regimen is likely to be disrupted due to multiple reasons following surgery.  Combination of anesthesia, postoperative narcotics, change in appetite and fluid intake all can affect your bowels.  In order to avoid complications following surgery, here are some recommendations in order to help you during your recovery period. ° °Colace (docusate) - Pick up an over-the-counter form of Colace or another stool softener and take twice a day as long as you are requiring postoperative pain medications.  Take with a full glass of water daily.  If you experience loose stools or diarrhea, hold the colace until you stool forms back up.  If your symptoms do not get better within 1 week or if they get worse, check with your doctor. ° °Dulcolax (bisacodyl) - Pick up over-the-counter and take as directed by the product packaging as needed to assist with the movement of your bowels.  Take with a full glass of water.  Use this product as needed if not relieved by Colace only.  ° °MiraLax (polyethylene glycol) - Pick up over-the-counter to have on hand.  MiraLax is a solution that will increase the amount of water in your bowels to assist with bowel movements.  Take as directed and can mix with a glass of water, juice, soda, coffee, or tea.  Take if you go more than two days without a movement. °Do not use MiraLax more than once per day. Call your doctor if you are still constipated or irregular after using this medication for 7 days in a row. ° °If you continue to have problems with postoperative constipation, please contact the office for further assistance and recommendations.  If you experience "the worst abdominal pain ever" or develop nausea or vomiting, please contact the office immediatly for further recommendations for treatment. ° °ITCHING ° If you experience itching with your medications, try taking only a single pain pill, or even half a pain pill  at a time.  You can also use Benadryl over the counter for itching or also to help with sleep.  ° °TED HOSE STOCKINGS °Wear the elastic stockings on both legs for three weeks following surgery during the day but you may remove then at night for sleeping. ° °MEDICATIONS °See your medication summary on the “After Visit Summary” that the nursing staff will review with you prior to discharge.  You may have some home medications which will be placed on hold until you complete the course of blood thinner medication.  It is important for you to complete the blood thinner medication as prescribed by your surgeon.  Continue your approved medications as instructed at time of discharge. °Do not drive while taking narcotics.  ° °PRECAUTIONS °If you experience chest pain or shortness of breath - call 911 immediately for transfer to the hospital emergency department.  °If you develop a fever greater that 101 F, purulent drainage from wound, increased redness or drainage from wound, foul odor from the wound/dressing, or calf pain - CONTACT YOUR SURGEON.   °                                                °  FOLLOW-UP APPOINTMENTS °Make sure you keep all of your appointments after your operation with your surgeon and caregivers. You should call the office at [phone]  and make an appointment for approximately one week after the date of your surgery or on the date instructed by your surgeon outlined in the "After Visit Summary". ° °RANGE OF MOTION AND STRENGTHENING EXERCISES  °Rehabilitation of the knee is important following a knee injury or an operation. After just a few days of immobilization, the muscles of the thigh which control the knee become weakened and shrink (atrophy). Knee exercises are designed to build up the tone and strength of the thigh muscles and to improve knee motion. Often times heat used for twenty to thirty minutes before working out will loosen up your tissues and help with improving the range of motion  but do not use heat for the first two weeks following surgery. These exercises can be done on a training (exercise) mat, on the floor, on a table or on a bed. Use what ever works the best and is most comfortable for you Knee exercises include: ° °QUAD STRENGTHENING EXERCISES °Strengthening Quadriceps Sets ° °Tighten muscles on top of thigh by pushing knees down into floor or table. °Hold for 20 seconds. Repeat 10 times. °Do 2 sessions per day. ° ° ° ° °Strengthening Terminal Knee Extension ° °With knee bent over bolster, straighten knee by tightening muscle on top of thigh. Be sure to keep bottom of knee on bolster. °Hold for 20 seconds. Repeat 10 times. °Do 2 sessions per day. ° ° °Straight Leg with Bent Knee ° °Lie on back with opposite leg bent. Keep involved knee slightly bent at knee and raise leg 4-6". Hold for 10 seconds. °Repeat 20 times per set. °Do 2 sets per session. °Do 2 sessions per day. ° °

## 2015-07-04 NOTE — Anesthesia Procedure Notes (Signed)
Procedure Name: LMA Insertion Date/Time: 07/04/2015 2:13 PM Performed by: Deliah Boston Pre-anesthesia Checklist: Patient identified, Emergency Drugs available, Suction available and Patient being monitored Patient Re-evaluated:Patient Re-evaluated prior to inductionOxygen Delivery Method: Circle system utilized Preoxygenation: Pre-oxygenation with 100% oxygen Intubation Type: IV induction Ventilation: Mask ventilation without difficulty LMA: LMA inserted LMA Size: 4.0 Number of attempts: 1 Placement Confirmation: positive ETCO2 and breath sounds checked- equal and bilateral Tube secured with: Tape Dental Injury: Teeth and Oropharynx as per pre-operative assessment

## 2015-07-12 DIAGNOSIS — Z4789 Encounter for other orthopedic aftercare: Secondary | ICD-10-CM | POA: Diagnosis not present

## 2015-07-17 DIAGNOSIS — I2510 Atherosclerotic heart disease of native coronary artery without angina pectoris: Secondary | ICD-10-CM | POA: Diagnosis not present

## 2015-07-17 DIAGNOSIS — E785 Hyperlipidemia, unspecified: Secondary | ICD-10-CM | POA: Diagnosis not present

## 2015-07-19 ENCOUNTER — Telehealth: Payer: Self-pay | Admitting: Cardiovascular Disease

## 2015-07-19 MED ORDER — AMBULATORY NON FORMULARY MEDICATION: 324.0000 mg | Freq: Every day | Status: DC

## 2015-07-19 NOTE — Telephone Encounter (Signed)
**Note De-Identified Tyechia Allmendinger Obfuscation** The pt states that he has 2 cardiologists: Dr Burt Knack and a cardiologist at [facility]. He states that he has been asked to participate in a study of ASA 81 mg vs. ASA 324 mg for pts with heart disease through [facility].  The pt is currently taking 81 mg of ASA daily. He states that he would like to participate in the study and wants to know if it is ok with Dr Burt Knack for him to take 324 mg of ASA daily as that is the dose they want him to try.  Please advise.

## 2015-07-19 NOTE — Telephone Encounter (Signed)
New message     Can pt switch from 81mg  aspirin to 324mg  aspirin daily?  It is part of a study at [place] [facility].

## 2015-07-19 NOTE — Telephone Encounter (Signed)
This is fine with me. thanks °

## 2015-07-19 NOTE — Telephone Encounter (Signed)
The pt is advised and he verbalized understanding and thanked me for calling him back with the response so soon. Asa 324 mg daily has been updated in the pts chart.

## 2015-07-24 ENCOUNTER — Other Ambulatory Visit: Payer: Self-pay

## 2015-07-24 MED ORDER — SIMVASTATIN 40 MG PO TABS: 40.0000 mg | ORAL_TABLET | Freq: Every day | ORAL | Status: DC

## 2015-07-30 ENCOUNTER — Telehealth: Payer: Self-pay

## 2015-07-30 NOTE — Telephone Encounter (Signed)
Per [facility] 07/17/15 OV note from Dr Posey Pronto (the pt's cardiologist at [facility]) the pt is going to continue Enalapril.  Because the pt has followed with this MD most recently the refill should be authorized by Dr Posey Pronto.

## 2015-08-03 ENCOUNTER — Other Ambulatory Visit: Payer: Self-pay | Admitting: Cardiovascular Disease

## 2015-08-03 MED ORDER — ENALAPRIL MALEATE 2.5 MG PO TABS: 2.5000 mg | ORAL_TABLET | Freq: Every day | ORAL | Status: DC

## 2015-08-07 DIAGNOSIS — Z4789 Encounter for other orthopedic aftercare: Secondary | ICD-10-CM | POA: Diagnosis not present

## 2015-08-28 ENCOUNTER — Other Ambulatory Visit: Payer: Self-pay

## 2015-08-28 ENCOUNTER — Other Ambulatory Visit: Payer: Self-pay | Admitting: *Deleted

## 2015-08-28 MED ORDER — SIMVASTATIN 40 MG PO TABS: 40.0000 mg | ORAL_TABLET | Freq: Every day | ORAL | Status: DC

## 2015-08-28 NOTE — Telephone Encounter (Signed)
Cardiologist at [facility] has most recently seen the pt and labs have not been checked by our office since 2016.  No labs are available through [facility].  Okay to refill #30 no additional refills but must needs to arrange follow-up with Dr Burt Knack.  If the pt does not schedule a follow-up then future refills for all cardiac medications will have to be handled by cardiologist at [facility].

## 2015-08-28 NOTE — Telephone Encounter (Signed)
Please advise on refill request. Snapshot has it listed that the patient is followed by [facility] cardiology. Thanks, MI

## 2015-08-28 NOTE — Telephone Encounter (Signed)
Spoke with patient and he stated that he is still wanting to see Dr Burt Knack yearly. He would like to schedule an appointment. I transferred him to scheduling.

## 2015-08-28 NOTE — Telephone Encounter (Signed)
Medication Detail      Disp Refills Start End     enalapril (VASOTEC) 2.5 MG tablet 30 tablet 0 08/03/2015     Sig - Route: Take 1 tablet (2.5 mg total) by mouth daily. - Oral    Notes to Pharmacy: Please call our office to schedule an overdue yearly appointment before anymore refills. [phone]. Thank you 1st attempt    E-Prescribing Status: Receipt confirmed by pharmacy (08/03/2015 3:44 PM EDT)     Pharmacy    [facility] [place], [place].

## 2015-08-29 ENCOUNTER — Telehealth: Payer: Self-pay | Admitting: Cardiovascular Disease

## 2015-08-29 MED ORDER — SIMVASTATIN 40 MG PO TABS: 40.0000 mg | ORAL_TABLET | Freq: Every day | ORAL | Status: DC

## 2015-08-29 MED ORDER — ENALAPRIL MALEATE 2.5 MG PO TABS: 2.5000 mg | ORAL_TABLET | Freq: Every day | ORAL | Status: DC

## 2015-08-29 NOTE — Telephone Encounter (Signed)
I spoke with the pt and scheduled him for follow-up with Dr Burt Knack in November.  The pt was recently seen by Cardiologist at [facility] and we did not feel like it would be beneficial for him to be seen immediately by another cardiologist (pt denies any cardiac issues). Prescriptions refilled with 90 day supply per the pt's request.

## 2015-08-29 NOTE — Telephone Encounter (Signed)
New message  Tried to schedule him with Burt Knack and a PA no appt available.   *STAT* If patient is at the pharmacy, call can be transferred to refill team.   1. Which medications need to be refilled? (please list name of each medication and dose if known) Simvastatin, Enalopril, he is not sure of the mg  2. Which pharmacy/location (including street and city if local pharmacy) is medication to be sent to? [facility] on [address] @ [place]  3. Do they need a 30 day or 90 day supply? 90, 90

## 2015-09-04 DIAGNOSIS — Z4789 Encounter for other orthopedic aftercare: Secondary | ICD-10-CM | POA: Diagnosis not present

## 2015-09-17 ENCOUNTER — Ambulatory Visit: Payer: Medicare Other | Admitting: Physician Assistant

## 2015-11-02 DIAGNOSIS — Z23 Encounter for immunization: Secondary | ICD-10-CM | POA: Diagnosis not present

## 2015-11-05 DIAGNOSIS — D239 Other benign neoplasm of skin, unspecified: Secondary | ICD-10-CM | POA: Diagnosis not present

## 2015-11-05 DIAGNOSIS — L821 Other seborrheic keratosis: Secondary | ICD-10-CM | POA: Diagnosis not present

## 2015-11-05 DIAGNOSIS — L309 Dermatitis, unspecified: Secondary | ICD-10-CM | POA: Diagnosis not present

## 2015-11-29 ENCOUNTER — Encounter: Payer: Self-pay | Admitting: Cardiovascular Disease

## 2015-12-13 ENCOUNTER — Ambulatory Visit (INDEPENDENT_AMBULATORY_CARE_PROVIDER_SITE_OTHER): Payer: Medicare Other | Admitting: Cardiovascular Disease

## 2015-12-13 ENCOUNTER — Encounter: Payer: Self-pay | Admitting: Cardiovascular Disease

## 2015-12-13 VITALS — BP 120/70 | HR 62 | Ht 71.0 in | Wt 206.8 lb

## 2015-12-13 DIAGNOSIS — E785 Hyperlipidemia, unspecified: Secondary | ICD-10-CM | POA: Diagnosis not present

## 2015-12-13 DIAGNOSIS — I2510 Atherosclerotic heart disease of native coronary artery without angina pectoris: Secondary | ICD-10-CM | POA: Diagnosis not present

## 2015-12-13 NOTE — Patient Instructions (Signed)

## 2015-12-13 NOTE — Progress Notes (Signed)
Cardiology Office Note Date:  12/13/2015   ID:  Zachary Barron, DOB 1940/08/19, MRN [id number]  PCP:  Irven Shelling, MD  Cardiologist:  Sherren Mocha, MD    Chief Complaint  Patient presents with  . coronary atherosclerosis     History of Present Illness: Zachary Barron is a 75 y.o. male who presents for  follow-up of coronary artery disease. The patient presented in 2009 with an anterior infarct. He's been treated with drug-eluting stents in the LAD and left circumflex and a bare-metal stent in the diagonal. There was a subsequent intervention done in the LAD with a bare-metal stent.  The patient is doing well. He's had some limitation related to orthopedic problems over the last year. He's doing better now and is back to regular exercise. His weight is up about 10 pounds from last year. His diet is unchanged. He denies any exertional chest pain, shortness of breath, or lightheadedness. He's had no palpitations. He is enrolled in a study evaluating different aspirin doses and is taking full dose aspirin 325 mg daily. He has noted some GI upset over the last several months. He plans to call the study coordinator to discuss this.  Past Medical History:  Diagnosis Date  . Arthritis   . Fibroma   . GERD (gastroesophageal reflux disease)   . Hyperlipemia   . LBP (low back pain)   . MI (myocardial infarction)   . Stented coronary artery     Past Surgical History:  Procedure Laterality Date  . bilateral duproyn's contracture surgery     . CORONARY STENT PLACEMENT    . KNEE ARTHROSCOPY Left 07/04/2015   Procedure: ARTHROSCOPY LEFT KNEE WITH MENSICAL DEBRIDEMENT;  Surgeon: Gaynelle Arabian, MD;  Location: [facility] ORS;  Service: Orthopedics;  Laterality: Left;  . left foot surgery    . left rotator cuff repair       Current Outpatient Prescriptions  Medication Sig Dispense Refill  . acetaminophen (TYLENOL) 500 MG tablet Take 1,000 mg by mouth every 6 (six) hours as needed (For pain.).      Marland Kitchen AMBULATORY NON FORMULARY MEDICATION Take 324 mg by mouth daily. Medication Name: Aspirin 324 mg (In study through [facility]) 30 capsule 0  . betamethasone dipropionate (DIPROLENE) 0.05 % cream Apply 1 application topically daily as needed. For skin irritation  0  . Cholecalciferol (VITAMIN D3) 1000 UNITS CAPS Take 1,000 Units by mouth daily.     . diclofenac sodium (VOLTAREN) 1 % GEL Apply 2 g topically 4 (four) times daily as needed (For pain.).     Marland Kitchen diclofenac sodium (VOLTAREN) 1 % GEL Apply 1 application topically daily as needed. pain    . enalapril (VASOTEC) 2.5 MG tablet Take 1 tablet (2.5 mg total) by mouth daily. 90 tablet 2  . HYDROcodone-acetaminophen (NORCO) 5-325 MG tablet Take 1-2 tablets by mouth every 4 (four) hours as needed for moderate pain. 30 tablet 0  . methocarbamol (ROBAXIN) 500 MG tablet Take 1 tablet (500 mg total) by mouth 4 (four) times daily. As needed for muscle spasm 30 tablet 1  . nitroGLYCERIN (NITROSTAT) 0.4 MG SL tablet Place 1 tablet (0.4 mg total) under the tongue every 5 (five) minutes as needed for chest pain. 25 tablet 3  . OVER THE COUNTER MEDICATION Apply 1 application topically daily as needed (For pain.). Deep Blue Rub    . sertraline (ZOLOFT) 25 MG tablet Take 25 mg by mouth at bedtime.     . simvastatin (ZOCOR) 40 MG  tablet Take 1 tablet (40 mg total) by mouth at bedtime. 90 tablet 2   No current facility-administered medications for this visit.    Allergies:   Other   Social History:  The patient  reports that he quit smoking about 40 years ago. He has never used smokeless tobacco. He reports that he drinks alcohol. He reports that he does not use drugs.   Family History:  The patient's  family history includes Hypertension in his mother; Parkinsonism in his other.   ROS:  Please see the history of present illness.  Otherwise, review of systems is positive for Hip and back pain.  All other systems are reviewed and negative.   PHYSICAL EXAM: VS:   BP 120/70   Pulse 62   Ht 5\' 11"  (1.803 m)   Wt 93.8 kg (206 lb 12.8 oz)   BMI 28.84 kg/m  , BMI Body mass index is 28.84 kg/m. GEN: Well nourished, well developed, in no acute distress  HEENT: normal  Neck: no JVD, no masses. No carotid bruits Cardiac: RRR without murmur or gallop                Respiratory:  clear to auscultation bilaterally, normal work of breathing GI: soft, nontender, nondistended, + BS MS: no deformity or atrophy  Ext: no pretibial edema, pedal pulses 2+= bilaterally Skin: warm and dry, no rash Neuro:  Strength and sensation are intact Psych: euthymic mood, full affect  EKG:  EKG is ordered today. The ekg ordered today shows normal sinus rhythm 62 bpm, right bundle branch block, no change from previous tracings.  Recent Labs: 06/28/2015: BUN 19; Creatinine, Ser 0.98; Hemoglobin 13.4; Platelets 253; Potassium 4.2; Sodium 138   Lipid Panel     Component Value Date/Time   CHOL 138 06/01/2014 1027   TRIG 56.0 06/01/2014 1027   HDL 66.30 06/01/2014 1027   CHOLHDL 2 06/01/2014 1027   VLDL 11.2 06/01/2014 1027   LDLCALC 61 06/01/2014 1027      Wt Readings from Last 3 Encounters:  12/13/15 93.8 kg (206 lb 12.8 oz)  07/04/15 92.5 kg (204 lb)  06/28/15 92.5 kg (204 lb)    ASSESSMENT AND PLAN: 1.  Coronary artery disease, native vessel, without angina: Medical program reviewed and will continue. He will discuss aspirin dosing with the study coordinator. With good functional capacity, regular exercise, and no exertional symptoms, do not think there is an indication for surveillance treadmill testing.  2. Hyperlipidemia: Lipids from Dr. Laurann Montana reviewed. LDL is at goal. Triglycerides were elevated. Discussed lifestyle modification, diet, and exercise. He has set a goal to lose 10 pounds over the next year.  Overall he appears to be doing well. He continues to follow with Dr. Posey Pronto at [facility] on a yearly basis and I will plan to see back in one year as  well.  Current medicines are reviewed with the patient today.  The patient does not have concerns regarding medicines.  Labs/ tests ordered today include:   Orders Placed This Encounter  Procedures  . EKG 12-Lead   Disposition:   FU one year  Signed, Sherren Mocha, MD  12/13/2015 12:55 PM    [address], [place], [place]  [address] Phone: [phone]; Fax: [phone]

## 2016-01-14 DIAGNOSIS — M79642 Pain in left hand: Secondary | ICD-10-CM | POA: Diagnosis not present

## 2016-01-14 DIAGNOSIS — R52 Pain, unspecified: Secondary | ICD-10-CM | POA: Insufficient documentation

## 2016-01-14 DIAGNOSIS — M720 Palmar fascial fibromatosis [Dupuytren]: Secondary | ICD-10-CM | POA: Insufficient documentation

## 2016-01-14 DIAGNOSIS — M79641 Pain in right hand: Secondary | ICD-10-CM | POA: Diagnosis not present

## 2016-01-17 ENCOUNTER — Other Ambulatory Visit: Payer: Self-pay | Admitting: Orthopedic Surgery

## 2016-02-01 DIAGNOSIS — H9201 Otalgia, right ear: Secondary | ICD-10-CM | POA: Diagnosis not present

## 2016-02-01 DIAGNOSIS — H6121 Impacted cerumen, right ear: Secondary | ICD-10-CM | POA: Diagnosis not present

## 2016-02-18 ENCOUNTER — Telehealth: Payer: Self-pay | Admitting: Cardiovascular Disease

## 2016-02-18 NOTE — Telephone Encounter (Signed)
F/u Message  Pt call requesting to speak with RN to f/u on surgical clearance. Pt states he would like a call back as soon as possible; his surgical procedure is coming up very soon. Please call back to discuss

## 2016-02-18 NOTE — Telephone Encounter (Signed)
Dr Burt Knack completed clearance already and this is scanned under the media tab from 02/15/2016. In review of clearance request there was not a request for the pt to hold ASA. I will forward this message to Dr Burt Knack to review request (they would like to know if he could stop taking aspirin before the surgery and how many days prior and how for long after surgery).  I have made the pt aware that I am waiting on MD response.

## 2016-02-18 NOTE — Telephone Encounter (Signed)
Zachary Barron is calling from [facility] and she states that they received the surgical clearance.The patient currently takes aspirin and they would like to know if he could stop taking aspirin before the surgery and how many days prior and how for long after surgery?

## 2016-02-19 NOTE — Telephone Encounter (Signed)
[place] for the patient to hold aspirin 5-7 days for surgery if necessary

## 2016-02-19 NOTE — Telephone Encounter (Signed)
°  Follow Up  Zachary Barron states she received the faxed clearance. However, she is requesting clarification if patient is okay to stop ASA 2 days prior to procedure.  Surgery scheduled: Tuesday 02/26/16.  Please call.

## 2016-02-20 NOTE — Telephone Encounter (Signed)
I spoke with Jeani Hawking and made her aware that per Dr Burt Knack the pt can hold ASA 5-7 days prior to surgery if needed. I will fax this note to [phone], Attn: Jeani Hawking. I spoke with the pt's wife and made her aware of this information.

## 2016-02-21 ENCOUNTER — Encounter (HOSPITAL_BASED_OUTPATIENT_CLINIC_OR_DEPARTMENT_OTHER): Payer: Self-pay | Admitting: *Deleted

## 2016-02-21 NOTE — Progress Notes (Signed)
PMH and Cardiology notes reviewed by Dr. Gifford Shave, ok for day surgery 02/26/16.

## 2016-02-26 ENCOUNTER — Encounter (HOSPITAL_BASED_OUTPATIENT_CLINIC_OR_DEPARTMENT_OTHER): Payer: Self-pay | Admitting: Anesthesiology

## 2016-02-26 ENCOUNTER — Ambulatory Visit (HOSPITAL_BASED_OUTPATIENT_CLINIC_OR_DEPARTMENT_OTHER)
Admission: RE | Admit: 2016-02-26 | Discharge: 2016-02-26 | Disposition: A | Discharge status: Home / Self Care | Payer: Medicare Other | Source: Ambulatory Visit | Attending: Orthopedic Surgery | Admitting: Orthopedic Surgery

## 2016-02-26 ENCOUNTER — Ambulatory Visit (HOSPITAL_BASED_OUTPATIENT_CLINIC_OR_DEPARTMENT_OTHER): Payer: Medicare Other | Admitting: Anesthesiology

## 2016-02-26 ENCOUNTER — Encounter (HOSPITAL_BASED_OUTPATIENT_CLINIC_OR_DEPARTMENT_OTHER)
Admission: RE | Disposition: A | Discharge status: Home / Self Care | Payer: Self-pay | Source: Ambulatory Visit | Attending: Orthopedic Surgery

## 2016-02-26 DIAGNOSIS — M720 Palmar fascial fibromatosis [Dupuytren]: Secondary | ICD-10-CM | POA: Diagnosis not present

## 2016-02-26 DIAGNOSIS — I4510 Unspecified right bundle-branch block: Secondary | ICD-10-CM | POA: Insufficient documentation

## 2016-02-26 DIAGNOSIS — I252 Old myocardial infarction: Secondary | ICD-10-CM | POA: Insufficient documentation

## 2016-02-26 DIAGNOSIS — Z955 Presence of coronary angioplasty implant and graft: Secondary | ICD-10-CM | POA: Diagnosis not present

## 2016-02-26 DIAGNOSIS — I2510 Atherosclerotic heart disease of native coronary artery without angina pectoris: Secondary | ICD-10-CM | POA: Diagnosis not present

## 2016-02-26 DIAGNOSIS — Z87891 Personal history of nicotine dependence: Secondary | ICD-10-CM | POA: Insufficient documentation

## 2016-02-26 DIAGNOSIS — K219 Gastro-esophageal reflux disease without esophagitis: Secondary | ICD-10-CM | POA: Diagnosis not present

## 2016-02-26 DIAGNOSIS — E785 Hyperlipidemia, unspecified: Secondary | ICD-10-CM | POA: Diagnosis not present

## 2016-02-26 HISTORY — PX: FASCIECTOMY: SHX6525

## 2016-02-26 HISTORY — DX: Bifascicular block: I45.2

## 2016-02-26 LAB — POCT I-STAT, CHEM 8
BUN: 23 mg/dL — ABNORMAL HIGH (ref 6–20)
CALCIUM ION: 1.24 mmol/L (ref 1.15–1.40)
CHLORIDE: 104 mmol/L (ref 101–111)
Creatinine, Ser: 1 mg/dL (ref 0.61–1.24)
Glucose, Bld: 97 mg/dL (ref 65–99)
HEMATOCRIT: 41 % (ref 39.0–52.0)
Hemoglobin: 13.9 g/dL (ref 13.0–17.0)
POTASSIUM: 4.1 mmol/L (ref 3.5–5.1)
SODIUM: 138 mmol/L (ref 135–145)
TCO2: 25 mmol/L (ref 0–100)

## 2016-02-26 SURGERY — FASCIECTOMY, PALM
Anesthesia: General | Site: Hand | Laterality: Left

## 2016-02-26 MED ORDER — LIDOCAINE HCL (CARDIAC) 20 MG/ML IV SOLN
INTRAVENOUS | Status: DC | PRN
  Administered 2016-02-26: 30 mg via INTRAVENOUS

## 2016-02-26 MED ORDER — CHLORHEXIDINE GLUCONATE 4 % EX LIQD: 60.0000 mL | Freq: Once | CUTANEOUS | Status: DC

## 2016-02-26 MED ORDER — CEFAZOLIN SODIUM-DEXTROSE 2-4 GM/100ML-% IV SOLN
2.0000 g | INTRAVENOUS | Status: AC
  Administered 2016-02-26: 2 g via INTRAVENOUS

## 2016-02-26 MED ORDER — FENTANYL CITRATE (PF) 100 MCG/2ML IJ SOLN
INTRAMUSCULAR | Status: AC
  Filled 2016-02-26: qty 2

## 2016-02-26 MED ORDER — BUPIVACAINE HCL (PF) 0.25 % IJ SOLN
INTRAMUSCULAR | Status: AC
  Filled 2016-02-26: qty 90

## 2016-02-26 MED ORDER — HYDROCODONE-ACETAMINOPHEN 5-325 MG PO TABS: 1.0000 | ORAL_TABLET | Freq: Four times a day (QID) | ORAL | 0 refills | Status: DC | PRN

## 2016-02-26 MED ORDER — DEXAMETHASONE SODIUM PHOSPHATE 10 MG/ML IJ SOLN
INTRAMUSCULAR | Status: DC | PRN
  Administered 2016-02-26: 5 mg via INTRAVENOUS

## 2016-02-26 MED ORDER — THROMBIN 5000 UNITS EX SOLR
CUTANEOUS | Status: AC
  Filled 2016-02-26: qty 5000

## 2016-02-26 MED ORDER — MIDAZOLAM HCL 2 MG/2ML IJ SOLN: 1.0000 mg | INTRAMUSCULAR | Status: DC | PRN

## 2016-02-26 MED ORDER — LACTATED RINGERS IV SOLN
INTRAVENOUS | Status: DC
  Administered 2016-02-26 (×2): via INTRAVENOUS

## 2016-02-26 MED ORDER — ONDANSETRON HCL 4 MG/2ML IJ SOLN
INTRAMUSCULAR | Status: DC | PRN
  Administered 2016-02-26: 4 mg via INTRAVENOUS

## 2016-02-26 MED ORDER — PROPOFOL 10 MG/ML IV BOLUS
INTRAVENOUS | Status: DC | PRN
  Administered 2016-02-26: 150 mg via INTRAVENOUS

## 2016-02-26 MED ORDER — BUPIVACAINE HCL (PF) 0.25 % IJ SOLN
INTRAMUSCULAR | Status: DC | PRN
  Administered 2016-02-26: 9 mL

## 2016-02-26 MED ORDER — FENTANYL CITRATE (PF) 100 MCG/2ML IJ SOLN
25.0000 ug | INTRAMUSCULAR | Status: DC | PRN
  Administered 2016-02-26 (×2): 50 ug via INTRAVENOUS

## 2016-02-26 MED ORDER — SCOPOLAMINE 1 MG/3DAYS TD PT72: 1.0000 | MEDICATED_PATCH | Freq: Once | TRANSDERMAL | Status: DC | PRN

## 2016-02-26 MED ORDER — EPHEDRINE SULFATE 50 MG/ML IJ SOLN
INTRAMUSCULAR | Status: DC | PRN
  Administered 2016-02-26: 10 mg via INTRAVENOUS

## 2016-02-26 MED ORDER — CEFAZOLIN SODIUM-DEXTROSE 2-4 GM/100ML-% IV SOLN
INTRAVENOUS | Status: AC
  Filled 2016-02-26: qty 100

## 2016-02-26 MED ORDER — FENTANYL CITRATE (PF) 100 MCG/2ML IJ SOLN
50.0000 ug | INTRAMUSCULAR | Status: DC | PRN
  Administered 2016-02-26 (×2): 50 ug via INTRAVENOUS

## 2016-02-26 SURGICAL SUPPLY — 45 items
BLADE MINI RND TIP GREEN BEAV (BLADE) ×4 IMPLANT
BLADE SURG 15 STRL LF DISP TIS (BLADE) ×1 IMPLANT
BLADE SURG 15 STRL SS (BLADE) ×1
BNDG COHESIVE 3X5 TAN STRL LF (GAUZE/BANDAGES/DRESSINGS) ×2 IMPLANT
BNDG ESMARK 4X9 LF (GAUZE/BANDAGES/DRESSINGS) ×2 IMPLANT
BNDG GAUZE ELAST 4 BULKY (GAUZE/BANDAGES/DRESSINGS) ×2 IMPLANT
CHLORAPREP W/TINT 26ML (MISCELLANEOUS) ×2 IMPLANT
CORDS BIPOLAR (ELECTRODE) ×2 IMPLANT
COVER BACK TABLE 60X90IN (DRAPES) ×2 IMPLANT
COVER MAYO STAND STRL (DRAPES) ×2 IMPLANT
CUFF TOURNIQUET SINGLE 18IN (TOURNIQUET CUFF) ×2 IMPLANT
DECANTER SPIKE VIAL GLASS SM (MISCELLANEOUS) IMPLANT
DRAPE EXTREMITY T 121X128X90 (DRAPE) ×2 IMPLANT
DRAPE SURG 17X23 STRL (DRAPES) ×2 IMPLANT
GAUZE SPONGE 4X4 12PLY STRL (GAUZE/BANDAGES/DRESSINGS) ×2 IMPLANT
GAUZE XEROFORM 1X8 LF (GAUZE/BANDAGES/DRESSINGS) ×2 IMPLANT
GLOVE BIOGEL PI IND STRL 7.0 (GLOVE) ×2 IMPLANT
GLOVE BIOGEL PI IND STRL 7.5 (GLOVE) ×1 IMPLANT
GLOVE BIOGEL PI IND STRL 8.5 (GLOVE) ×1 IMPLANT
GLOVE BIOGEL PI INDICATOR 7.0 (GLOVE) ×2
GLOVE BIOGEL PI INDICATOR 7.5 (GLOVE) ×1
GLOVE BIOGEL PI INDICATOR 8.5 (GLOVE) ×1
GLOVE ECLIPSE 6.5 STRL STRAW (GLOVE) ×2 IMPLANT
GLOVE SURG ORTHO 8.0 STRL STRW (GLOVE) ×2 IMPLANT
GLOVE SURG SS PI 7.5 STRL IVOR (GLOVE) ×2 IMPLANT
GOWN STRL REUS W/ TWL LRG LVL3 (GOWN DISPOSABLE) ×2 IMPLANT
GOWN STRL REUS W/TWL LRG LVL3 (GOWN DISPOSABLE) ×2
GOWN STRL REUS W/TWL XL LVL3 (GOWN DISPOSABLE) ×2 IMPLANT
LOOP VESSEL MAXI BLUE (MISCELLANEOUS) ×2 IMPLANT
NEEDLE PRECISIONGLIDE 27X1.5 (NEEDLE) ×2 IMPLANT
NS IRRIG 1000ML POUR BTL (IV SOLUTION) ×2 IMPLANT
PACK BASIN DAY SURGERY FS (CUSTOM PROCEDURE TRAY) ×2 IMPLANT
PAD CAST 3X4 CTTN HI CHSV (CAST SUPPLIES) ×1 IMPLANT
PADDING CAST COTTON 3X4 STRL (CAST SUPPLIES) ×1
SLEEVE SCD COMPRESS KNEE MED (MISCELLANEOUS) ×2 IMPLANT
SPLINT PLASTER CAST XFAST 3X15 (CAST SUPPLIES) ×10 IMPLANT
SPLINT PLASTER XTRA FASTSET 3X (CAST SUPPLIES) ×10
STOCKINETTE 4X48 STRL (DRAPES) ×2 IMPLANT
SUT ETHILON 4 0 PS 2 18 (SUTURE) ×4 IMPLANT
SUT SILK 2 0 FS (SUTURE) ×2 IMPLANT
SYR BULB 3OZ (MISCELLANEOUS) ×2 IMPLANT
SYR CONTROL 10ML LL (SYRINGE) ×2 IMPLANT
TOWEL OR 17X24 6PK STRL BLUE (TOWEL DISPOSABLE) ×2 IMPLANT
TOWEL OR NON WOVEN STRL DISP B (DISPOSABLE) ×2 IMPLANT
UNDERPAD 30X30 (UNDERPADS AND DIAPERS) IMPLANT

## 2016-02-26 NOTE — Anesthesia Procedure Notes (Signed)
Procedure Name: LMA Insertion Date/Time: 02/26/2016 10:53 AM Performed by: Melynda Ripple D Pre-anesthesia Checklist: Patient identified, Emergency Drugs available, Suction available and Patient being monitored Patient Re-evaluated:Patient Re-evaluated prior to inductionOxygen Delivery Method: Circle system utilized Preoxygenation: Pre-oxygenation with 100% oxygen Intubation Type: IV induction Ventilation: Mask ventilation without difficulty LMA: LMA inserted LMA Size: 4.0 Number of attempts: 1 Airway Equipment and Method: Bite block Placement Confirmation: positive ETCO2 Tube secured with: Tape Dental Injury: Teeth and Oropharynx as per pre-operative assessment

## 2016-02-26 NOTE — Discharge Instructions (Addendum)

## 2016-02-26 NOTE — Anesthesia Preprocedure Evaluation (Addendum)
Anesthesia Evaluation  Patient identified by MRN, date of birth, ID band Patient awake    Reviewed: Allergy & Precautions, NPO status , Patient's Chart, lab work & pertinent test results  Airway Mallampati: II  TM Distance: >3 FB     Dental   Pulmonary former smoker,    breath sounds clear to auscultation       Cardiovascular + CAD and + Past MI  + dysrhythmias  Rhythm:Regular Rate:Normal     Neuro/Psych    GI/Hepatic Neg liver ROS, GERD  ,  Endo/Other  negative endocrine ROS  Renal/GU negative Renal ROS     Musculoskeletal  (+) Arthritis ,   Abdominal   Peds  Hematology   Anesthesia Other Findings   Reproductive/Obstetrics                             Anesthesia Physical Anesthesia Plan  ASA: III  Anesthesia Plan: General   Post-op Pain Management:    Induction: Intravenous  Airway Management Planned: LMA  Additional Equipment:   Intra-op Plan:   Post-operative Plan: Extubation in OR  Informed Consent: I have reviewed the patients History and Physical, chart, labs and discussed the procedure including the risks, benefits and alternatives for the proposed anesthesia with the patient or authorized representative who has indicated his/her understanding and acceptance.   Dental advisory given  Plan Discussed with: CRNA and Anesthesiologist  Anesthesia Plan Comments:         Anesthesia Quick Evaluation

## 2016-02-26 NOTE — Brief Op Note (Signed)
02/26/2016  12:07 PM  PATIENT:  Zachary Barron  76 y.o. male  PRE-OPERATIVE DIAGNOSIS:  dupuytren's contracture left thumb, index, ring  POST-OPERATIVE DIAGNOSIS:  dupuytren's contracture left thumb, index, ring  PROCEDURE:  Procedure(s): Segmental FASCIECTOMY left thumb, index and ring fingers (Left)  SURGEON:  Surgeon(s) and Role:    * Daryll Brod, MD - Primary  PHYSICIAN ASSISTANT:   ASSISTANTS: none   ANESTHESIA:   General and local  EBL:  Total I/O In: 1000 [I.V.:1000] Out: 10 [Blood:10]  BLOOD ADMINISTERED:none  DRAINS: none   LOCAL MEDICATIONS USED:  BUPIVICAINE   SPECIMEN:  Excision  DISPOSITION OF SPECIMEN:  PATHOLOGY  COUNTS:  YES  TOURNIQUET:   Total Tourniquet Time Documented: Upper Arm (Left) - 54 minutes Total: Upper Arm (Left) - 54 minutes   DICTATION: .Other Dictation: Dictation Number [id number]  PLAN OF CARE: Discharge to home after PACU  PATIENT DISPOSITION:  PACU - hemodynamically stable.

## 2016-02-26 NOTE — H&P (Signed)
  Zachary Barron is an 76 y.o. male.   Chief Complaint: dupuytren's contracture left hand HPI: Zachary Barron is a 76 yo male with Dupuytren's contracture left hand. He was last seen a year ago. He has had his right side operated on in the past. He has noted increasing contracture to the ring thumb and index with first webspace being involved in his left hand. He was seen a year ago with a probability of scheduling. He states however he has had knee problems and has had to have knee surgery over the past year. Is not complaining of major discomfort. He has a strong family history of Dupuytren's contracture in 6 siblings all of who have had operations.      Past Medical History:  Diagnosis Date  . Arthritis   . Fibroma   . GERD (gastroesophageal reflux disease)   . Hyperlipemia   . LBP (low back pain)   . MI (myocardial infarction)   . Right BBB/left ant fasc block   . Stented coronary artery     Past Surgical History:  Procedure Laterality Date  . bilateral duproyn's contracture surgery     . CORONARY STENT PLACEMENT    . KNEE ARTHROSCOPY Left 07/04/2015   Procedure: ARTHROSCOPY LEFT KNEE WITH MENSICAL DEBRIDEMENT;  Surgeon: Gaynelle Arabian, MD;  Location: [facility];  Service: Orthopedics;  Laterality: Left;  . left foot surgery    . left rotator cuff repair       Family History  Problem Relation Age of Onset  . Hypertension Mother   . Parkinsonism Other    Social History:  reports that he quit smoking about 40 years ago. He has never used smokeless tobacco. He reports that he drinks alcohol. He reports that he does not use drugs.  Allergies:  Allergies  Allergen Reactions  . Other Other (See Comments)    Beta blocker- severe bradycardia    No prescriptions prior to admission.    No results found for this or any previous visit (from the past 48 hour(s)).  No results found.   Pertinent items are noted in HPI.  Height 5\' 11"  (1.803 m), weight 92.1 kg (203 lb).  General  appearance: alert, cooperative and appears stated age Head: Normocephalic, without obvious abnormality Neck: no JVD Resp: clear to auscultation bilaterally Cardio: regular rate and rhythm, S1, S2 normal, no murmur, click, rub or gallop GI: soft, non-tender; bowel sounds normal; no masses,  no organomegaly Extremities: contractures index ,thumb,ring fingers left hand Pulses: 2+ and symmetric Skin: Skin color, texture, turgor normal. No rashes or lesions Neurologic: Grossly normal Incision/Wound: na  Assessment/Plan Assessment:   Contracture of palmar fascia    Plan: He is well aware of alternative treatments including fasciotomy Xiaflex injection aponeurotomy. He would like to proceed to have fasciectomy done. Have discussed the segmental fasciectomy's of the ring thumb index first webspace. Pre-peri-and postoperative course been discussed along with risk complications. He is aware that there is no guarantee to the surgery the possibility of infection recurrence injury to arteries nerves tendons incomplete release symptoms dystrophy.      Charlette Hennings R 02/26/2016, 9:11 AM

## 2016-02-26 NOTE — Op Note (Signed)
Dictation Number [id number]

## 2016-02-26 NOTE — Transfer of Care (Signed)
Immediate Anesthesia Transfer of Care Note  Patient: Zachary Barron  Procedure(s) Performed: Procedure(s): Segmental FASCIECTOMY left thumb, index and ring fingers (Left)  Patient Location: PACU  Anesthesia Type:General  Level of Consciousness: sedated  Airway & Oxygen Therapy: Patient Spontanous Breathing and Patient connected to face mask oxygen  Post-op Assessment: Report given to RN and Post -op Vital signs reviewed and stable  Post vital signs: Reviewed and stable  Last Vitals:  Vitals:   02/26/16 0957  BP: 124/67  Pulse: (!) 59  Resp: 18  Temp: 36.6 C    Last Pain:  Vitals:   02/26/16 0957  TempSrc: Oral         Complications: No apparent anesthesia complications

## 2016-02-27 ENCOUNTER — Encounter (HOSPITAL_BASED_OUTPATIENT_CLINIC_OR_DEPARTMENT_OTHER): Payer: Self-pay | Admitting: Orthopedic Surgery

## 2016-02-27 NOTE — Op Note (Signed)
NAME:  Zachary Barron, Zachary Barron               ACCOUNT NO.:  [id number]  MEDICAL RECORD NO.:  [id number]  LOCATION:                                 FACILITY:  PHYSICIAN:  Daryll Brod, M.D.            DATE OF BIRTH:  DATE OF PROCEDURE:  02/26/2016 DATE OF DISCHARGE:                              OPERATIVE REPORT   PREOPERATIVE DIAGNOSIS:  Dupuytren's contracture of left thumb, index, and ring fingers.  POSTOPERATIVE DIAGNOSIS:  Dupuytren's contracture of left thumb, index, and ring fingers.  OPERATION:  Segmental fasciectomy of left thumb first web space, index, and ring fingers.  SURGEON:  Daryll Brod, MD.  ASSISTANT:  None.  ANESTHESIA:  General with local infiltration.  ANESTHESIOLOGIST:  Green.  HISTORY:  The patient is a 76 year old male with a history of Dupuytren's contracture with significant scarring and contracture of the first web space, thumb, index, and ring fingers.  He is admitted for segmental fasciectomy to each of those digits.  He is aware that there is no guarantee to the surgery, the possibility of infection; recurrence of injury to arteries, nerves, tendons; incomplete relief of symptoms; dystrophy.  In the preoperative area, the patient was seen, the extremity was marked by both patient and surgeon.  Antibiotic given.  He is aware of other treatment options including total fasciectomy, aponeurotomy, Xiaflex injection.  He is also aware that areas of the skin may be left open to granulate and heal in secondarily.  PROCEDURE IN DETAIL:  The patient was brought to the operating room, where a general anesthetic was carried out without difficulty under the direction of the Anesthesia Department.  He was prepped using ChloraPrep in supine position with the left arm free.  A 3-minute dry time was allowed.  Time-out taken, confirming the patient and procedure.  The limb was exsanguinated with an Esmarch bandage.  Tourniquet placed on the upper arm was inflated to 250  mmHg.  The ring finger was attended to first.  Several transverse incisions were made on the palm to the finger.  This was carried down through subcutaneous tissue.  The cord was isolated proximally and a wrist transection performed including the ring, index, and middle fingers.  This was done over the neurovascular bundles, which were identified.  A second transverse incision was made distally over the metacarpophalangeal joint crease area.  This was where the central cord deviated into 2 lateral bands to capture the metacarpophalangeal joint, pulled into flexion.  The neurovascular bundles were identified and protected.  A spiral nerve was noted on the ulnar aspect.  The cord was isolated and a section removed including the 2 limbs that went to about either side the A2 pulley.  This was sent to Pathology.  This left a large open area on the skin.  The skin was undermined as much as possible, would be unable to be closed.  A separate incision was then made in the first web space.  A large cord was identified transversely.  The radial digital nerve to the index finger was identified.  The cord was resected taking a segment out.  A second cord was present to the  index finger.  The radial digital nerve was identified beneath the skin.  This was isolated and transected.  A segment again removed.  A separate incision was then made on the thumb, this was on the proximal phalanx.  This was carried down through subcutaneous tissue.  The radial digital nerve was identified along with the artery and protected.  A segment of the cord was removed from the phalanx central cord allowing the thumb to come into full extension. The ulnar digital nerve was identified and protected throughout the procedure.  A separate incision was then made over the proximal phalanx of the index finger.  A primarily radial cord was identified.  The neurovascular bundle on the radial side was identified, and the cord  was segmentally resected.  This allowed the PIP and MP joint to come fully straight on the index finger.  The only wounds that were able to be closed were the incision on the first web space.  This was closed with interrupted 4-0 nylon sutures after copiously irrigating each of the wounds with saline.  A sterile compressive dressing with nonadherent gauze was placed.  The tourniquet was deflated.  All fingers immediately pinked.  A dorsal splint was applied along with a completion of the dressing and the patient was taken to the recovery room for observation in satisfactory condition.  He will be discharged to home to return to the [facility] in 1 week on Norco.          ______________________________ Daryll Brod, M.D.     GK/MEDQ  D:  02/26/2016  T:  02/27/2016  Job:  [id number]

## 2016-02-27 NOTE — Anesthesia Postprocedure Evaluation (Addendum)
Anesthesia Post Note  Patient: Zachary Barron  Procedure(s) Performed: Procedure(s) (LRB): Segmental FASCIECTOMY left thumb, index and ring fingers (Left)  Patient location during evaluation: PACU Anesthesia Type: General Level of consciousness: awake and alert Pain management: pain level controlled Vital Signs Assessment: post-procedure vital signs reviewed and stable Respiratory status: spontaneous breathing, nonlabored ventilation, respiratory function stable and patient connected to nasal cannula oxygen Cardiovascular status: blood pressure returned to baseline and stable Postop Assessment: no signs of nausea or vomiting Anesthetic complications: no       Last Vitals:  Vitals:   02/26/16 1241 02/26/16 1259  BP: 114/70 126/75  Pulse: 63 70  Resp: 16 18  Temp:      Last Pain:  Vitals:   02/27/16 1003  TempSrc:   PainSc: 0-No pain                 Effie Berkshire

## 2016-03-05 DIAGNOSIS — M720 Palmar fascial fibromatosis [Dupuytren]: Secondary | ICD-10-CM | POA: Diagnosis not present

## 2016-03-05 DIAGNOSIS — M79642 Pain in left hand: Secondary | ICD-10-CM | POA: Diagnosis not present

## 2016-03-05 DIAGNOSIS — T148XXA Other injury of unspecified body region, initial encounter: Secondary | ICD-10-CM | POA: Insufficient documentation

## 2016-03-10 DIAGNOSIS — M79642 Pain in left hand: Secondary | ICD-10-CM | POA: Diagnosis not present

## 2016-03-10 DIAGNOSIS — M720 Palmar fascial fibromatosis [Dupuytren]: Secondary | ICD-10-CM | POA: Diagnosis not present

## 2016-03-10 DIAGNOSIS — T148XXA Other injury of unspecified body region, initial encounter: Secondary | ICD-10-CM | POA: Diagnosis not present

## 2016-05-13 DIAGNOSIS — I2510 Atherosclerotic heart disease of native coronary artery without angina pectoris: Secondary | ICD-10-CM | POA: Diagnosis not present

## 2016-05-13 DIAGNOSIS — I252 Old myocardial infarction: Secondary | ICD-10-CM | POA: Diagnosis not present

## 2016-05-13 DIAGNOSIS — K219 Gastro-esophageal reflux disease without esophagitis: Secondary | ICD-10-CM | POA: Diagnosis not present

## 2016-05-13 DIAGNOSIS — E7800 Pure hypercholesterolemia, unspecified: Secondary | ICD-10-CM | POA: Diagnosis not present

## 2016-05-13 DIAGNOSIS — Z1389 Encounter for screening for other disorder: Secondary | ICD-10-CM | POA: Diagnosis not present

## 2016-05-13 DIAGNOSIS — Z0000 Encounter for general adult medical examination without abnormal findings: Secondary | ICD-10-CM | POA: Diagnosis not present

## 2016-06-01 ENCOUNTER — Other Ambulatory Visit: Payer: Self-pay | Admitting: Cardiovascular Disease

## 2016-06-20 DIAGNOSIS — M79642 Pain in left hand: Secondary | ICD-10-CM | POA: Diagnosis not present

## 2016-06-20 DIAGNOSIS — T148XXA Other injury of unspecified body region, initial encounter: Secondary | ICD-10-CM | POA: Diagnosis not present

## 2016-06-20 DIAGNOSIS — M720 Palmar fascial fibromatosis [Dupuytren]: Secondary | ICD-10-CM | POA: Diagnosis not present

## 2016-07-09 ENCOUNTER — Other Ambulatory Visit: Payer: Self-pay | Admitting: Cardiovascular Disease

## 2016-07-11 NOTE — Addendum Note (Signed)
Addendum  created 07/11/16 0907 by Effie Berkshire, MD   Sign clinical note

## 2016-07-24 DIAGNOSIS — I2510 Atherosclerotic heart disease of native coronary artery without angina pectoris: Secondary | ICD-10-CM | POA: Diagnosis not present

## 2016-08-12 DIAGNOSIS — H2513 Age-related nuclear cataract, bilateral: Secondary | ICD-10-CM | POA: Diagnosis not present

## 2016-08-12 DIAGNOSIS — H04123 Dry eye syndrome of bilateral lacrimal glands: Secondary | ICD-10-CM | POA: Diagnosis not present

## 2016-10-08 ENCOUNTER — Other Ambulatory Visit: Payer: Self-pay | Admitting: *Deleted

## 2016-10-08 MED ORDER — ENALAPRIL MALEATE 2.5 MG PO TABS: 2.5000 mg | ORAL_TABLET | Freq: Every day | ORAL | 0 refills | Status: DC

## 2016-11-27 DIAGNOSIS — Z23 Encounter for immunization: Secondary | ICD-10-CM | POA: Diagnosis not present

## 2016-12-11 ENCOUNTER — Ambulatory Visit: Payer: Self-pay | Admitting: Podiatry

## 2017-01-05 IMAGING — NM NM MISC PROCEDURE
3 series · 18 of 18 positions shown · non-contrast
Comparison: none

[Series 1: rest_(id)_sa · 6.4mm · 6.40mm/px · 6 of 64 frames shown]
[frame 6/64]
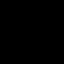
[frame 16/64]
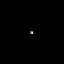
[frame 27/64]
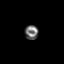
[frame 38/64]
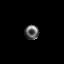
[frame 48/64]
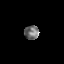
[frame 59/64]
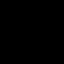

[Series 1: stress-sum-em_(id)_sa · 6.4mm · 6.40mm/px · 6 of 64 frames shown]
[frame 6/64]
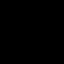
[frame 16/64]
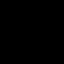
[frame 27/64]
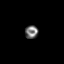
[frame 38/64]
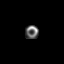
[frame 48/64]
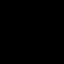
[frame 59/64]
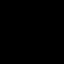

[Series 1: stress-gsp_(id)_sa · 6.4mm · 6.40mm/px · 6 of 512 frames shown]
[frame 43/512]
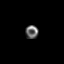
[frame 128/512]
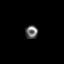
[frame 214/512]
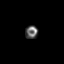
[frame 299/512]
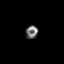
[frame 384/512]
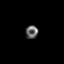
[frame 470/512]
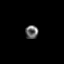

[18 of 18 positions shown; findings below may reference images not displayed]

Canned report from images found in remote index.

Refer to host system for actual result text.

## 2017-01-07 ENCOUNTER — Other Ambulatory Visit: Payer: Self-pay

## 2017-01-08 ENCOUNTER — Ambulatory Visit (INDEPENDENT_AMBULATORY_CARE_PROVIDER_SITE_OTHER): Payer: Medicare Other | Admitting: Podiatry

## 2017-01-08 ENCOUNTER — Ambulatory Visit (INDEPENDENT_AMBULATORY_CARE_PROVIDER_SITE_OTHER): Payer: Medicare Other

## 2017-01-08 VITALS — BP 130/76 | HR 58 | Ht 71.0 in | Wt 205.0 lb

## 2017-01-08 DIAGNOSIS — M779 Enthesopathy, unspecified: Secondary | ICD-10-CM | POA: Diagnosis not present

## 2017-01-08 DIAGNOSIS — M722 Plantar fascial fibromatosis: Secondary | ICD-10-CM | POA: Diagnosis not present

## 2017-01-08 DIAGNOSIS — M79671 Pain in right foot: Secondary | ICD-10-CM

## 2017-01-08 NOTE — Progress Notes (Signed)
Subjective:  Patient ID: Zachary Barron, male    DOB: 1940-07-23,  MRN: [id number]  Chief Complaint  Patient presents with  . Foot Pain    outside edge/bottom of right foot -    76 y.o. male presents with the above complaint.  Reports pain to the outside of both feet.  States he is very active does yoga, daily exercises for chronic back issues.  Reports that he swims 3-4 times a week states that he had a history of removing "Dupuytren's contracture"to the bottom of his foot and has had scar tissue in the area ever since  Past Medical History:  Diagnosis Date  . Arthritis   . Fibroma   . GERD (gastroesophageal reflux disease)   . Hyperlipemia   . LBP (low back pain)   . MI (myocardial infarction)   . Right BBB/left ant fasc block   . Stented coronary artery    Past Surgical History:  Procedure Laterality Date  . bilateral duproyn's contracture surgery     . CORONARY STENT PLACEMENT    . FASCIECTOMY Left 02/26/2016   Procedure: Segmental FASCIECTOMY left thumb, index and ring fingers;  Surgeon: Daryll Brod, MD;  Location: [facility];  Service: Orthopedics;  Laterality: Left;  . KNEE ARTHROSCOPY Left 07/04/2015   Procedure: ARTHROSCOPY LEFT KNEE WITH MENSICAL DEBRIDEMENT;  Surgeon: Gaynelle Arabian, MD;  Location: [facility];  Service: Orthopedics;  Laterality: Left;  . left foot surgery    . left rotator cuff repair       Current Outpatient Medications:  .  acetaminophen (TYLENOL) 500 MG tablet, Take 1,000 mg by mouth every 6 (six) hours as needed (For pain.). , Disp: , Rfl:  .  AMBULATORY NON FORMULARY MEDICATION, Take 324 mg by mouth daily. Medication Name: Aspirin 324 mg (In study through [facility]), Disp: 30 capsule, Rfl: 0 .  betamethasone dipropionate (DIPROLENE) 0.05 % cream, Apply 1 application topically daily as needed. For skin irritation, Disp: , Rfl: 0 .  Cholecalciferol (VITAMIN D3) 1000 UNITS CAPS, Take 1,000 Units by mouth daily. , Disp: , Rfl:  .  diclofenac sodium  (VOLTAREN) 1 % GEL, Apply 2 g topically 4 (four) times daily as needed (For pain.). , Disp: , Rfl:  .  diclofenac sodium (VOLTAREN) 1 % GEL, Apply 1 application topically daily as needed. pain, Disp: , Rfl:  .  enalapril (VASOTEC) 2.5 MG tablet, Take 1 tablet (2.5 mg total) by mouth daily. Please call and schedule a one year follow up appointment 1st attempt, Disp: 90 tablet, Rfl: 0 .  HYDROcodone-acetaminophen (NORCO) 5-325 MG tablet, Take 1 tablet by mouth every 6 (six) hours as needed for moderate pain. (Patient not taking: Reported on 01/08/2017), Disp: 30 tablet, Rfl: 0 .  methocarbamol (ROBAXIN) 500 MG tablet, Take 1 tablet (500 mg total) by mouth 4 (four) times daily. As needed for muscle spasm, Disp: 30 tablet, Rfl: 1 .  nitroGLYCERIN (NITROSTAT) 0.4 MG SL tablet, Place 1 tablet (0.4 mg total) under the tongue every 5 (five) minutes as needed for chest pain., Disp: 25 tablet, Rfl: 3 .  OVER THE COUNTER MEDICATION, Apply 1 application topically daily as needed (For pain.). Deep Blue Rub, Disp: , Rfl:  .  sertraline (ZOLOFT) 25 MG tablet, Take 25 mg by mouth daily., Disp: , Rfl:  .  simvastatin (ZOCOR) 40 MG tablet, take 1 tablet by mouth at bedtime, Disp: 90 tablet, Rfl: 1  Allergies  Allergen Reactions  . Other Other (See Comments)  Beta blocker- severe bradycardia   Review of Systems negative except as noted in the HPI Objective:   Vitals:   01/08/17 0921  BP: 130/76  Pulse: (!) 58   General AA&O x3. Normal mood and affect.  Vascular Dorsalis pedis and posterior tibial pulses  present 2+ bilaterally  Capillary refill normal to all digits. Pedal hair growth normal.  Neurologic Epicritic sensation grossly present.  Dermatologic No open lesions. Interspaces clear of maceration. Nails well groomed and normal in appearance.  Orthopedic: MMT 5/5 in dorsiflexion, plantarflexion, inversion, and eversion. Normal joint ROM without pain or crepitus. Prominent fibrous tissue to the  arch bilaterally Patient fifth met capsule bilateral   She was taken and reviewed.  No acute fractures dislocations.  Degenerative changes noted to the midfoot and forefoot.  Increased soft tissue shadow of the medial arch bilaterally Assessment & Plan:  Patient was evaluated and treated and all questions answered.  Bilateral plantar arch pain, fifth met capsulitis -X-rays reviewed with patient. - Discussed possible orthotic therapy for capsulitis and due to enlargement of the medial arch -We will make appointment for fabrication of orthotics follow-up in 6 weeks after

## 2017-01-19 ENCOUNTER — Other Ambulatory Visit: Payer: Medicare Other | Admitting: Orthotics

## 2017-01-22 ENCOUNTER — Other Ambulatory Visit: Payer: Medicare Other | Admitting: Orthotics

## 2017-01-27 ENCOUNTER — Other Ambulatory Visit: Payer: Self-pay | Admitting: Cardiovascular Disease

## 2017-01-27 MED ORDER — SIMVASTATIN 40 MG PO TABS: 40.0000 mg | ORAL_TABLET | Freq: Every day | ORAL | 0 refills | Status: DC

## 2017-02-11 ENCOUNTER — Other Ambulatory Visit: Payer: Self-pay | Admitting: Podiatry

## 2017-02-11 DIAGNOSIS — M779 Enthesopathy, unspecified: Secondary | ICD-10-CM

## 2017-02-17 ENCOUNTER — Ambulatory Visit (INDEPENDENT_AMBULATORY_CARE_PROVIDER_SITE_OTHER): Payer: Self-pay | Admitting: Orthotics

## 2017-02-17 DIAGNOSIS — M722 Plantar fascial fibromatosis: Secondary | ICD-10-CM

## 2017-02-17 DIAGNOSIS — M779 Enthesopathy, unspecified: Secondary | ICD-10-CM

## 2017-02-17 NOTE — Progress Notes (Signed)
Patient came in today to pick up custom made foot orthotics.  The goals were accomplished and the patient reported no dissatisfaction with said orthotics.  Patient was advised of breakin period and how to report any issues.  $300 self pay. Signed ABN.

## 2017-02-18 DIAGNOSIS — I252 Old myocardial infarction: Secondary | ICD-10-CM | POA: Diagnosis not present

## 2017-02-18 DIAGNOSIS — I2510 Atherosclerotic heart disease of native coronary artery without angina pectoris: Secondary | ICD-10-CM | POA: Diagnosis not present

## 2017-02-18 DIAGNOSIS — M1712 Unilateral primary osteoarthritis, left knee: Secondary | ICD-10-CM | POA: Diagnosis not present

## 2017-03-11 ENCOUNTER — Ambulatory Visit (INDEPENDENT_AMBULATORY_CARE_PROVIDER_SITE_OTHER): Payer: Medicare Other | Admitting: Cardiovascular Disease

## 2017-03-11 ENCOUNTER — Encounter: Payer: Self-pay | Admitting: Cardiovascular Disease

## 2017-03-11 VITALS — BP 122/78 | HR 54 | Ht 71.0 in | Wt 204.1 lb

## 2017-03-11 DIAGNOSIS — E785 Hyperlipidemia, unspecified: Secondary | ICD-10-CM | POA: Diagnosis not present

## 2017-03-11 DIAGNOSIS — I2510 Atherosclerotic heart disease of native coronary artery without angina pectoris: Secondary | ICD-10-CM | POA: Diagnosis not present

## 2017-03-11 NOTE — Progress Notes (Signed)
Cardiology Office Note Date:  03/11/2017   ID:  Caesar Mannella, DOB 03-14-1940, MRN [id number]  PCP:  Lavone Orn, MD  Cardiologist:  Sherren Mocha, MD    Chief Complaint  Patient presents with  . Follow-up    CAD     History of Present Illness: Zachary Barron is a 77 y.o. male who presents for follow-up of coronary artery disease.  The patient initially presented in 2009 with an acute anterior infarct.  He has been treated with stenting of the LAD, left circumflex, and first diagonal branches.  He has been treated with both drug-eluting and bare metal stenting.  He is doing well from a cardiovascular perspective. Taking his medications on schedule without fail. Reports good exercise and activity level - working out with a Clinical research associate at [employer] regularly. He's lost 3# since his visit last year but is hoping to continue with another 5-10 pounds of weight loss.  The patient reports no exertional symptoms.  He denies chest pain, shortness of breath, edema, orthopnea, PND, or heart palpitations.  Past Medical History:  Diagnosis Date  . Arthritis   . Fibroma   . GERD (gastroesophageal reflux disease)   . Hyperlipemia   . LBP (low back pain)   . MI (myocardial infarction) ([place])   . Right BBB/left ant fasc block   . Stented coronary artery     Past Surgical History:  Procedure Laterality Date  . bilateral duproyn's contracture surgery     . CORONARY STENT PLACEMENT    . FASCIECTOMY Left 02/26/2016   Procedure: Segmental FASCIECTOMY left thumb, index and ring fingers;  Surgeon: Daryll Brod, MD;  Location: [facility];  Service: Orthopedics;  Laterality: Left;  . KNEE ARTHROSCOPY Left 07/04/2015   Procedure: ARTHROSCOPY LEFT KNEE WITH MENSICAL DEBRIDEMENT;  Surgeon: Gaynelle Arabian, MD;  Location: [facility];  Service: Orthopedics;  Laterality: Left;  . left foot surgery    . left rotator cuff repair       Current Outpatient Medications  Medication Sig Dispense Refill    . acetaminophen (TYLENOL) 500 MG tablet Take 1,000 mg by mouth every 6 (six) hours as needed (For pain.).     Marland Kitchen aspirin 81 MG chewable tablet Chew 81 mg by mouth daily.    . betamethasone dipropionate (DIPROLENE) 0.05 % cream Apply 1 application topically daily as needed. For skin irritation  0  . Cholecalciferol (VITAMIN D3) 1000 UNITS CAPS Take 2,000 Units by mouth daily.     . diclofenac sodium (VOLTAREN) 1 % GEL Apply 2 g topically 4 (four) times daily as needed (For pain.).     Marland Kitchen diclofenac sodium (VOLTAREN) 1 % GEL Apply 1 application topically daily as needed. pain    . enalapril (VASOTEC) 2.5 MG tablet Take 1 tablet (2.5 mg total) by mouth daily. Please call and schedule a one year follow up appointment 1st attempt 90 tablet 0  . methocarbamol (ROBAXIN) 500 MG tablet Take 1 tablet (500 mg total) by mouth 4 (four) times daily. As needed for muscle spasm 30 tablet 1  . nitroGLYCERIN (NITROSTAT) 0.4 MG SL tablet Place 1 tablet (0.4 mg total) under the tongue every 5 (five) minutes as needed for chest pain. 25 tablet 3  . OVER THE COUNTER MEDICATION Apply 1 application topically daily as needed (For pain.). Deep Blue Rub    . sertraline (ZOLOFT) 25 MG tablet Take 25 mg by mouth daily.    . simvastatin (ZOCOR) 40 MG tablet Take 1  tablet (40 mg total) by mouth at bedtime. Please keep upcoming appt for future refills. Thank  you 90 tablet 0   No current facility-administered medications for this visit.     Allergies:   Other   Social History:  The patient  reports that he quit smoking about 41 years ago. he has never used smokeless tobacco. He reports that he drinks alcohol. He reports that he does not use drugs.   Family History:  The patient's family history includes Hypertension in his mother; Parkinsonism in his other.    ROS:  Please see the history of present illness.  Otherwise, review of systems is positive for joint swelling, tremor.  All other systems are reviewed and negative.     PHYSICAL EXAM: VS:  BP 122/78   Pulse (!) 54   Ht 5\' 11"  (1.803 m)   Wt 204 lb 1.9 oz (92.6 kg)   BMI 28.47 kg/m  , BMI Body mass index is 28.47 kg/m. GEN: Well nourished, well developed, in no acute distress  HEENT: normal  Neck: no JVD, no masses. No carotid bruits Cardiac: RRR without murmur or gallop     Respiratory:  clear to auscultation bilaterally, normal work of breathing GI: soft, nontender, nondistended, + BS MS: no deformity or atrophy  Ext: no pretibial edema, pedal pulses 2+= bilaterally Skin: warm and dry, no rash Neuro:  Strength and sensation are intact Psych: euthymic mood, full affect  EKG:  EKG is ordered today. The ekg ordered today shows sinus brady 54 bpm, possible inferior infarct age-undetermined  Recent Labs: No results found for requested labs within last 8760 hours.   Lipid Panel     Component Value Date/Time   CHOL 138 06/01/2014 1027   TRIG 56.0 06/01/2014 1027   HDL 66.30 06/01/2014 1027   CHOLHDL 2 06/01/2014 1027   VLDL 11.2 06/01/2014 1027   LDLCALC 61 06/01/2014 1027      Wt Readings from Last 3 Encounters:  03/11/17 204 lb 1.9 oz (92.6 kg)  01/08/17 205 lb (93 kg)  02/26/16 205 lb 2 oz (93 kg)     ASSESSMENT AND PLAN: 1.  Coronary artery disease, native vessel, without angina: The patient is doing well on his current medical regimen.  He remains physically active with no exertional symptoms.  No changes are recommended.  He takes aspirin 81 mg daily for antiplatelet therapy and is on simvastatin for lipid lowering.  2.  Mixed hyperlipidemia: I reviewed his most recent lipids from 2018 which demonstrated total cholesterol of 138, HDL 65, LDL 53, and triglycerides 104.  He will continue to work on lifestyle modification with no changes made in his pharmacotherapy for hyperlipidemia.  3.  Tremor: He will discuss with Dr. Laurann Montana at his next visit regarding intermittent resting tremor.  Current medicines are reviewed with the  patient today.  The patient does not have concerns regarding medicines.  Labs/ tests ordered today include:   Orders Placed This Encounter  Procedures  . EKG 12-Lead    Disposition:   FU one year  Signed, Sherren Mocha, MD  03/11/2017 9:26 PM    [address], [place], [place]  [address] Phone: [phone]; Fax: [phone]

## 2017-03-11 NOTE — Patient Instructions (Signed)

## 2017-04-02 DIAGNOSIS — M170 Bilateral primary osteoarthritis of knee: Secondary | ICD-10-CM | POA: Diagnosis not present

## 2017-04-02 DIAGNOSIS — M25562 Pain in left knee: Secondary | ICD-10-CM | POA: Diagnosis not present

## 2017-04-20 ENCOUNTER — Other Ambulatory Visit: Payer: Self-pay | Admitting: Cardiovascular Disease

## 2017-05-07 DIAGNOSIS — M1712 Unilateral primary osteoarthritis, left knee: Secondary | ICD-10-CM | POA: Diagnosis not present

## 2017-05-13 DIAGNOSIS — M1712 Unilateral primary osteoarthritis, left knee: Secondary | ICD-10-CM | POA: Diagnosis not present

## 2017-05-21 DIAGNOSIS — M1712 Unilateral primary osteoarthritis, left knee: Secondary | ICD-10-CM | POA: Diagnosis not present

## 2017-07-01 DIAGNOSIS — L299 Pruritus, unspecified: Secondary | ICD-10-CM | POA: Diagnosis not present

## 2017-07-01 DIAGNOSIS — W57XXXA Bitten or stung by nonvenomous insect and other nonvenomous arthropods, initial encounter: Secondary | ICD-10-CM | POA: Diagnosis not present

## 2017-07-02 DIAGNOSIS — M1712 Unilateral primary osteoarthritis, left knee: Secondary | ICD-10-CM | POA: Diagnosis not present

## 2017-07-13 DIAGNOSIS — I2510 Atherosclerotic heart disease of native coronary artery without angina pectoris: Secondary | ICD-10-CM | POA: Diagnosis not present

## 2017-07-13 DIAGNOSIS — F419 Anxiety disorder, unspecified: Secondary | ICD-10-CM | POA: Diagnosis not present

## 2017-07-13 DIAGNOSIS — M1712 Unilateral primary osteoarthritis, left knee: Secondary | ICD-10-CM | POA: Diagnosis not present

## 2017-07-13 DIAGNOSIS — E7800 Pure hypercholesterolemia, unspecified: Secondary | ICD-10-CM | POA: Diagnosis not present

## 2017-07-13 DIAGNOSIS — Z1389 Encounter for screening for other disorder: Secondary | ICD-10-CM | POA: Diagnosis not present

## 2017-07-13 DIAGNOSIS — Z0000 Encounter for general adult medical examination without abnormal findings: Secondary | ICD-10-CM | POA: Diagnosis not present

## 2017-07-20 DIAGNOSIS — E7800 Pure hypercholesterolemia, unspecified: Secondary | ICD-10-CM | POA: Diagnosis not present

## 2017-07-28 ENCOUNTER — Telehealth: Payer: Self-pay | Admitting: *Deleted

## 2017-07-28 DIAGNOSIS — I2510 Atherosclerotic heart disease of native coronary artery without angina pectoris: Secondary | ICD-10-CM | POA: Diagnosis not present

## 2017-07-28 DIAGNOSIS — I10 Essential (primary) hypertension: Secondary | ICD-10-CM | POA: Diagnosis not present

## 2017-07-28 NOTE — Telephone Encounter (Signed)
   [facility] Pre-operative Risk Assessment    Request for surgical clearance:  1. What type of surgery is being performed? Arthroplasty Left Knee  2. When is this surgery scheduled?  TBD  3.  4. What type of clearance is required (medical clearance vs. Pharmacy clearance to hold med vs. Both)? BOTH    5. Are there any medications that need to be held prior to surgery and how long? TO  BE DETERMINED BY CLEARING  PROVIDER.   6. Practice name and name of physician performing surgery? [place] [facility]   7. What is your office phone number [phone]    7.   What is your office fax number [phone]   8.   Anesthesia type (None, local, MAC, general) ?  NONE   Zachary Barron M 07/28/2017, 1:22 PM  _________________________________________________________________   (provider comments below)

## 2017-07-29 NOTE — Telephone Encounter (Signed)
Dr. Burt Knack, there is request for surgery and if meds can be held. No specific mention of holding aspirin. If needed could he hold aspirin or should it be continued?  Please route response back to CV DIV PREOP  Thank you

## 2017-08-04 NOTE — Telephone Encounter (Signed)
   [place], MD  Chart reviewed as part of pre-operative protocol coverage. H/o CAD with anterior MI 2009 with PCI. Last nuc 2016 small apical scar without ischemia. Patient is on aspirin and ortho asking for [place] to determine if needing to hold prior to arthroplasty. I think Gae Bon meant to route this to Dr. Burt Knack, will do so. Dr. Burt Knack- Please route response to P CV DIV PREOP (the pre-op pool), then we'll follow up with a phone call to the patient. Thanks.  Charlie Pitter, PA-C 08/04/2017, 2:02 PM

## 2017-08-05 NOTE — Telephone Encounter (Signed)
If needed he is at low risk of holding ASA x 5 days prior to knee surgery. thanks

## 2017-08-06 NOTE — Telephone Encounter (Signed)
   Primary Cardiologist: Sherren Mocha, MD  Chart reviewed as part of pre-operative protocol coverage. Patient was contacted 08/06/2017 in reference to pre-operative risk assessment for pending surgery as outlined below.  Zachary Barron was last seen 02/2017 by Dr. Burt Knack. Since that day, Zachary Barron has done very well. He continues to exercise with a personal trainer about 1 hour several times a week without any angina, SOB, palpitations or syncope - definitely exceeds 4 METS. Wants to go ahead with knee surgery to remain as active as possible.  Therefore, based on ACC/AHA guidelines, the patient would be at acceptable risk for the planned procedure without further cardiovascular testing.   Per Dr. Burt Knack, "If needed he is at low risk of holding ASA x 5 days prior to knee surgery, thanks".  I will route this recommendation to the requesting party via Epic fax function and remove from pre-op pool.  Please call with questions.  Charlie Pitter, PA-C 08/06/2017, 7:52 AM

## 2017-08-20 DIAGNOSIS — M1712 Unilateral primary osteoarthritis, left knee: Secondary | ICD-10-CM | POA: Diagnosis not present

## 2017-09-01 DIAGNOSIS — H2513 Age-related nuclear cataract, bilateral: Secondary | ICD-10-CM | POA: Diagnosis not present

## 2017-09-01 DIAGNOSIS — H40013 Open angle with borderline findings, low risk, bilateral: Secondary | ICD-10-CM | POA: Diagnosis not present

## 2017-09-01 DIAGNOSIS — H04123 Dry eye syndrome of bilateral lacrimal glands: Secondary | ICD-10-CM | POA: Diagnosis not present

## 2017-09-14 DIAGNOSIS — H6123 Impacted cerumen, bilateral: Secondary | ICD-10-CM | POA: Diagnosis not present

## 2017-10-06 NOTE — H&P (Signed)
TOTAL KNEE ADMISSION H&P  Patient is being admitted for left knee medial unicompartmental arthroplasty.  Subjective:  Chief Complaint:left knee pain.  HPI: Zachary Barron, 77 y.o. male, has a history of pain and functional disability in the left knee due to arthritis and has failed non-surgical conservative treatments for greater than 12 weeks to includecorticosteriod injections, viscosupplementation injections and activity modification.  Onset of symptoms was gradual, starting 3 years ago with gradually worsening course since that time. The patient noted prior procedures on the knee to include  arthroscopy on the left knee(s).  Patient currently rates pain in the left knee(s) at 8 out of 10 with activity. Patient has night pain, worsening of pain with activity and weight bearing and joint swelling.  Patient has evidence of focal bone-on-bone change in the medial most aspect of the medial compartment with no evidence of any lateral or patellofemoral changes by imaging studies. There is no active infection.  Patient Active Problem List   Diagnosis Date Noted  . Open wound 03/05/2016  . Contracture of palmar fascia 01/14/2016  . Pain 01/14/2016  . Acute medial meniscal tear 07/04/2015  . Dyslipidemia 02/24/2011  . History of tobacco abuse 02/24/2011  . Hypertension 02/24/2011  . CAD (coronary artery disease) 02/24/2011  . Coronary atherosclerosis of native coronary artery 06/27/2010  . Other and unspecified hyperlipidemia 06/27/2010   Past Medical History:  Diagnosis Date  . Arthritis   . Fibroma   . GERD (gastroesophageal reflux disease)   . Hyperlipemia   . LBP (low back pain)   . MI (myocardial infarction) ([place])   . Right BBB/left ant fasc block   . Stented coronary artery     Past Surgical History:  Procedure Laterality Date  . bilateral duproyn's contracture surgery     . CORONARY STENT PLACEMENT    . FASCIECTOMY Left 02/26/2016   Procedure: Segmental FASCIECTOMY left thumb,  index and ring fingers;  Surgeon: Daryll Brod, MD;  Location: [facility];  Service: Orthopedics;  Laterality: Left;  . KNEE ARTHROSCOPY Left 07/04/2015   Procedure: ARTHROSCOPY LEFT KNEE WITH MENSICAL DEBRIDEMENT;  Surgeon: Gaynelle Arabian, MD;  Location: [facility];  Service: Orthopedics;  Laterality: Left;  . left foot surgery    . left rotator cuff repair       No current facility-administered medications for this encounter.    Current Outpatient Medications  Medication Sig Dispense Refill Last Dose  . acetaminophen (TYLENOL) 500 MG tablet Take 1,000 mg by mouth every 6 (six) hours as needed (For pain.).    Taking  . aspirin 81 MG chewable tablet Chew 81 mg by mouth daily.   Taking  . betamethasone dipropionate (DIPROLENE) 0.05 % cream Apply 1 application topically daily as needed. For skin irritation  0 Taking  . Cholecalciferol (VITAMIN D3) 1000 UNITS CAPS Take 2,000 Units by mouth daily.    Taking  . diclofenac sodium (VOLTAREN) 1 % GEL Apply 2 g topically 4 (four) times daily as needed (For pain.).    Taking  . diclofenac sodium (VOLTAREN) 1 % GEL Apply 1 application topically daily as needed. pain   Taking  . enalapril (VASOTEC) 2.5 MG tablet TAKE 1 TABLET(2.5 MG) BY MOUTH DAILY 90 tablet 3   . methocarbamol (ROBAXIN) 500 MG tablet Take 1 tablet (500 mg total) by mouth 4 (four) times daily. As needed for muscle spasm 30 tablet 1 Taking  . nitroGLYCERIN (NITROSTAT) 0.4 MG SL tablet Place 1 tablet (0.4 mg total) under the  tongue every 5 (five) minutes as needed for chest pain. 25 tablet 3 Taking  . OVER THE COUNTER MEDICATION Apply 1 application topically daily as needed (For pain.). Deep Blue Rub   Taking  . sertraline (ZOLOFT) 25 MG tablet Take 25 mg by mouth daily.   Taking  . simvastatin (ZOCOR) 40 MG tablet Take 1 tablet (40 mg total) by mouth at bedtime. Please keep upcoming appt for future refills. Thank  you 90 tablet 0 Taking   Allergies  Allergen Reactions  . Other  Other (See Comments)    Beta blocker- severe bradycardia    Social History   Tobacco Use  . Smoking status: Former Smoker    Last attempt to quit: 06/11/1975    Years since quitting: 42.3  . Smokeless tobacco: Never Used  Substance Use Topics  . Alcohol use: Yes    Comment: occ glass of wine     Family History  Problem Relation Age of Onset  . Hypertension Mother   . Parkinsonism Other      Review of Systems  Constitutional: Negative for chills and fever.  HENT: Negative for congestion, sore throat and tinnitus.   Eyes: Negative for double vision, photophobia and pain.  Respiratory: Negative for cough, shortness of breath and wheezing.   Cardiovascular: Negative for chest pain, palpitations and orthopnea.  Gastrointestinal: Negative for heartburn, nausea and vomiting.  Genitourinary: Negative for dysuria, frequency and urgency.  Musculoskeletal: Positive for joint pain.  Neurological: Negative for dizziness, weakness and headaches.  Psychiatric/Behavioral: Negative for depression.    Objective:  Physical Exam  Well nourished and well developed. General: Alert and oriented x3, cooperative and pleasant, no acute distress. Head: normocephalic, atraumatic, neck supple. Eyes: EOMI. Respiratory: breath sounds clear in all fields, no wheezing, rales, or rhonchi. Cardiovascular: Regular rate and rhythm, no murmurs, gallops or rubs.  Abdomen: non-tender to palpation and soft, normoactive bowel sounds. Musculoskeletal: Slightly antalgic gait on the left. Left Hip Exam: ROM: Normal without discomfort. There is no tenderness over the greater trochanter. There is no pain on provocative testing of the hip. Left Knee Exam: No effusion. Slight varus deformity.Range of motion is 0-135 degrees. No crepitus on range of motion of the knee. Severe medial joint line tenderness.No lateral joint line tenderness. Stable knee. Calves soft and nontender. Motor function intact in LE. Strength 5/5 LE  bilaterally. Neuro: Distal pulses 2+. Sensation to light touch intact in LE.  Vital signs in last 24 hours: Blood pressure: 126/82 mmHg Pulse: 64 bpm   Labs:   Estimated body mass index is 28.47 kg/m as calculated from the following:   Height as of 03/11/17: 5\' 11"  (1.803 m).   Weight as of 03/11/17: 92.6 kg.   Imaging Review Plain radiographs demonstrate advanced degenerative joint disease of the left medial compartment knee(s). The overall alignment isneutral. The bone quality appears to be adequate for age and reported activity level.   Preoperative templating of the joint replacement has been completed, documented, and submitted to the Operating Room personnel in order to optimize intra-operative equipment management.    Assessment/Plan:  End stage arthritis, left knee   The patient history, physical examination, clinical judgment of the provider and imaging studies are consistent with end stage degenerative joint disease of the left knee(s) and total knee arthroplasty is deemed medically necessary. The treatment options including medical management, injection therapy arthroscopy and arthroplasty were discussed at length. The risks and benefits of total knee arthroplasty were presented and reviewed. The  risks due to aseptic loosening, infection, stiffness, patella tracking problems, thromboembolic complications and other imponderables were discussed. The patient acknowledged the explanation, agreed to proceed with the plan and consent was signed. Patient is being admitted for inpatient treatment for surgery, pain control, PT, OT, prophylactic antibiotics, VTE prophylaxis, progressive ambulation and ADL's and discharge planning. The patient is planning to be discharged home with outpatient physical therapy.  Therapy Plans: outpatient therapy (to be set up by Leonides Grills, RN) Disposition: Home with wife Planned DVT Prophylaxis: Xarelto 10 mg daily (cardiac hx) DME needed: None PCP:  Dr. Laurann Montana  Cardiologist: Dr. Burt Knack (cardiac clearance provided) Allergies: None Other: Pt with history of MI, has 4 stents placed  - Patient was instructed on what medications to stop prior to surgery. - Follow-up visit in 2 weeks with Dr. Wynelle Link - Begin physical therapy following surgery - Pre-operative lab work as pre-surgical testing - Prescriptions will be provided in hospital at time of discharge  Theresa Duty, PA-C Orthopedic Surgery [facility] [place]

## 2017-10-28 ENCOUNTER — Encounter (HOSPITAL_COMMUNITY): Payer: Self-pay

## 2017-10-28 NOTE — Pre-Procedure Instructions (Signed)
Cardiac clearance Dr. Burt Knack 07/28/2017 chart/epic Surgical clearance Dr. Laurann Montana 07/13/2017 chart  The following are in epic: EKG 03/11/2017 Last office visit note Dr. Burt Knack 03/11/2017 Stress test 06/15/2014

## 2017-10-28 NOTE — Patient Instructions (Signed)
Your procedure is scheduled on: Wednesday, Sept 25, 2019   Surgery Time:  9:00AM-10:00AM   Report to [place]  Entrance    Report to admitting at 6:30 AM   Call this number if you have problems the morning of surgery [phone]   Do not eat food or drink liquids :After Midnight.   Brush your teeth the morning of surgery.   Do NOT smoke after Midnight   Take these medicines the morning of surgery with A SIP OF WATER: None              You may not have any metal on your body including jewelry, and body piercings             Do not wear lotions, powders, perfumes/cologne, or deodorant                           Men may shave face and neck.   Do not bring valuables to the hospital. [place].   Contacts, dentures or bridgework may not be worn into surgery.   Leave suitcase in the car. After surgery it may be brought to your room.   Special Instructions: Bring a copy of your healthcare power of attorney and living will documents         the day of surgery if you haven't scanned them in before.              Please read over the following fact sheets you were given:  [facility] - Preparing for Surgery Before surgery, you can play an important role.  Because skin is not sterile, your skin needs to be as free of germs as possible.  You can reduce the number of germs on your skin by washing with CHG (chlorahexidine gluconate) soap before surgery.  CHG is an antiseptic cleaner which kills germs and bonds with the skin to continue killing germs even after washing. Please DO NOT use if you have an allergy to CHG or antibacterial soaps.  If your skin becomes reddened/irritated stop using the CHG and inform your nurse when you arrive at Short Stay. Do not shave (including legs and underarms) for at least 48 hours prior to the first CHG shower.  You may shave your face/neck.  Please follow these instructions  carefully:  1.  Shower with CHG Soap the night before surgery and the  morning of surgery.  2.  If you choose to wash your hair, wash your hair first as usual with your normal  shampoo.  3.  After you shampoo, rinse your hair and body thoroughly to remove the shampoo.                             4.  Use CHG as you would any other liquid soap.  You can apply chg directly to the skin and wash.  Gently with a scrungie or clean washcloth.  5.  Apply the CHG Soap to your body ONLY FROM THE NECK DOWN.   Do   not use on face/ open                           Wound or open sores. Avoid contact with eyes, ears mouth and  genitals (private parts).                       Wash face,  Genitals (private parts) with your normal soap.             6.  Wash thoroughly, paying special attention to the area where your    surgery  will be performed.  7.  Thoroughly rinse your body with warm water from the neck down.  8.  DO NOT shower/wash with your normal soap after using and rinsing off the CHG Soap.                9.  Pat yourself dry with a clean towel.            10.  Wear clean pajamas.            11.  Place clean sheets on your bed the night of your first shower and do not  sleep with pets. Day of Surgery : Do not apply any lotions/deodorants the morning of surgery.  Please wear clean clothes to the hospital/surgery center.  FAILURE TO FOLLOW THESE INSTRUCTIONS MAY RESULT IN THE CANCELLATION OF YOUR SURGERY  PATIENT SIGNATURE_________________________________  NURSE SIGNATURE__________________________________  ________________________________________________________________________   [name]  An incentive spirometer is a tool that can help keep your lungs clear and active. This tool measures how well you are filling your lungs with each breath. Taking long deep breaths may help reverse or decrease the chance of developing breathing (pulmonary) problems (especially infection) following:  A  long period of time when you are unable to move or be active. BEFORE THE PROCEDURE   If the spirometer includes an indicator to show your best effort, your nurse or respiratory therapist will set it to a desired goal.  If possible, sit up straight or lean slightly forward. Try not to slouch.  Hold the incentive spirometer in an upright position. INSTRUCTIONS FOR USE  1. Sit on the edge of your bed if possible, or sit up as far as you can in bed or on a chair. 2. Hold the incentive spirometer in an upright position. 3. Breathe out normally. 4. Place the mouthpiece in your mouth and seal your lips tightly around it. 5. Breathe in slowly and as deeply as possible, raising the piston or the ball toward the top of the column. 6. Hold your breath for 3-5 seconds or for as long as possible. Allow the piston or ball to fall to the bottom of the column. 7. Remove the mouthpiece from your mouth and breathe out normally. 8. Rest for a few seconds and repeat Steps 1 through 7 at least 10 times every 1-2 hours when you are awake. Take your time and take a few normal breaths between deep breaths. 9. The spirometer may include an indicator to show your best effort. Use the indicator as a goal to work toward during each repetition. 10. After each set of 10 deep breaths, practice coughing to be sure your lungs are clear. If you have an incision (the cut made at the time of surgery), support your incision when coughing by placing a pillow or rolled up towels firmly against it. Once you are able to get out of bed, walk around indoors and cough well. You may stop using the incentive spirometer when instructed by your caregiver.  RISKS AND COMPLICATIONS  Take your time so you do not get dizzy or light-headed.  If you are in  pain, you may need to take or ask for pain medication before doing incentive spirometry. It is harder to take a deep breath if you are having pain. AFTER USE  Rest and breathe slowly and  easily.  It can be helpful to keep track of a log of your progress. Your caregiver can provide you with a simple table to help with this. If you are using the spirometer at home, follow these instructions: [place] IF:   You are having difficultly using the spirometer.  You have trouble using the spirometer as often as instructed.  Your pain medication is not giving enough relief while using the spirometer.  You develop fever of 100.5 F (38.1 C) or higher. SEEK IMMEDIATE MEDICAL CARE IF:   You cough up bloody sputum that had not been present before.  You develop fever of 102 F (38.9 C) or greater.  You develop worsening pain at or near the incision site. MAKE SURE YOU:   Understand these instructions.  Will watch your condition.  Will get help right away if you are not doing well or get worse. Document Released: 06/09/2006 Document Revised: 04/21/2011 Document Reviewed: 08/10/2006 Exit[employer] Patient Information 2014 Exit[employer], [place].   ________________________________________________________________________  WHAT IS A BLOOD TRANSFUSION? Blood Transfusion Information  A transfusion is the replacement of blood or some of its parts. Blood is made up of multiple cells which provide different functions.  Red blood cells carry oxygen and are used for blood loss replacement.  White blood cells fight against infection.  Platelets control bleeding.  Plasma helps clot blood.  Other blood products are available for specialized needs, such as hemophilia or other clotting disorders. BEFORE THE TRANSFUSION  Who gives blood for transfusions?   Healthy volunteers who are fully evaluated to make sure their blood is safe. This is blood bank blood. Transfusion therapy is the safest it has ever been in the practice of medicine. Before blood is taken from a donor, a complete history is taken to make sure that person has no history of diseases nor engages in risky social  behavior (examples are intravenous drug use or sexual activity with multiple partners). The donor's travel history is screened to minimize risk of transmitting infections, such as malaria. The donated blood is tested for signs of infectious diseases, such as HIV and hepatitis. The blood is then tested to be sure it is compatible with you in order to minimize the chance of a transfusion reaction. If you or a relative donates blood, this is often done in anticipation of surgery and is not appropriate for emergency situations. It takes many days to process the donated blood. RISKS AND COMPLICATIONS Although transfusion therapy is very safe and saves many lives, the main dangers of transfusion include:   Getting an infectious disease.  Developing a transfusion reaction. This is an allergic reaction to something in the blood you were given. Every precaution is taken to prevent this. The decision to have a blood transfusion has been considered carefully by your caregiver before blood is given. Blood is not given unless the benefits outweigh the risks. AFTER THE TRANSFUSION  Right after receiving a blood transfusion, you will usually feel much better and more energetic. This is especially true if your red blood cells have gotten low (anemic). The transfusion raises the level of the red blood cells which carry oxygen, and this usually causes an energy increase.  The nurse administering the transfusion will monitor you carefully for complications. HOME CARE INSTRUCTIONS  No special instructions are needed after a transfusion. You may find your energy is better. Speak with your caregiver about any limitations on activity for underlying diseases you may have. SEEK MEDICAL CARE IF:   Your condition is not improving after your transfusion.  You develop redness or irritation at the intravenous (IV) site. SEEK IMMEDIATE MEDICAL CARE IF:  Any of the following symptoms occur over the next 12 hours:  Shaking  chills.  You have a temperature by mouth above 102 F (38.9 C), not controlled by medicine.  Chest, back, or muscle pain.  People around you feel you are not acting correctly or are confused.  Shortness of breath or difficulty breathing.  Dizziness and fainting.  You get a rash or develop hives.  You have a decrease in urine output.  Your urine turns a dark color or changes to pink, red, or brown. Any of the following symptoms occur over the next 10 days:  You have a temperature by mouth above 102 F (38.9 C), not controlled by medicine.  Shortness of breath.  Weakness after normal activity.  The white part of the eye turns yellow (jaundice).  You have a decrease in the amount of urine or are urinating less often.  Your urine turns a dark color or changes to pink, red, or brown. Document Released: 01/25/2000 Document Revised: 04/21/2011 Document Reviewed: 09/13/2007 [facility] Patient Information 2014 [place], [place].  _______________________________________________________________________

## 2017-10-29 ENCOUNTER — Encounter (HOSPITAL_COMMUNITY): Payer: Self-pay

## 2017-10-29 ENCOUNTER — Other Ambulatory Visit: Payer: Self-pay

## 2017-10-29 ENCOUNTER — Encounter (HOSPITAL_COMMUNITY)
Admission: RE | Admit: 2017-10-29 | Discharge: 2017-10-29 | Disposition: A | Discharge status: Home / Self Care | Payer: Medicare Other | Source: Ambulatory Visit | Attending: Orthopedic Surgery | Admitting: Orthopedic Surgery

## 2017-10-29 DIAGNOSIS — Z79899 Other long term (current) drug therapy: Secondary | ICD-10-CM | POA: Diagnosis not present

## 2017-10-29 DIAGNOSIS — E785 Hyperlipidemia, unspecified: Secondary | ICD-10-CM | POA: Diagnosis not present

## 2017-10-29 DIAGNOSIS — I10 Essential (primary) hypertension: Secondary | ICD-10-CM | POA: Diagnosis not present

## 2017-10-29 DIAGNOSIS — Z7982 Long term (current) use of aspirin: Secondary | ICD-10-CM | POA: Diagnosis not present

## 2017-10-29 DIAGNOSIS — Z01812 Encounter for preprocedural laboratory examination: Secondary | ICD-10-CM | POA: Insufficient documentation

## 2017-10-29 DIAGNOSIS — Z87891 Personal history of nicotine dependence: Secondary | ICD-10-CM | POA: Diagnosis not present

## 2017-10-29 DIAGNOSIS — I2510 Atherosclerotic heart disease of native coronary artery without angina pectoris: Secondary | ICD-10-CM | POA: Insufficient documentation

## 2017-10-29 DIAGNOSIS — M1712 Unilateral primary osteoarthritis, left knee: Secondary | ICD-10-CM | POA: Diagnosis not present

## 2017-10-29 HISTORY — DX: Atrial premature depolarization: I49.1

## 2017-10-29 HISTORY — DX: Palmar fascial fibromatosis (dupuytren): M72.0

## 2017-10-29 HISTORY — DX: Tremor, unspecified: R25.1

## 2017-10-29 HISTORY — DX: Atherosclerotic heart disease of native coronary artery without angina pectoris: I25.10

## 2017-10-29 HISTORY — DX: Essential (primary) hypertension: I10

## 2017-10-29 LAB — CBC
HCT: 43.4 % (ref 39.0–52.0)
Hemoglobin: 14.7 g/dL (ref 13.0–17.0)
MCH: 32 pg (ref 26.0–34.0)
MCHC: 33.9 g/dL (ref 30.0–36.0)
MCV: 94.3 fL (ref 78.0–100.0)
PLATELETS: 274 10*3/uL (ref 150–400)
RBC: 4.6 MIL/uL (ref 4.22–5.81)
RDW: 13.5 % (ref 11.5–15.5)
WBC: 6.3 10*3/uL (ref 4.0–10.5)

## 2017-10-29 LAB — COMPREHENSIVE METABOLIC PANEL
ALT: 18 U/L (ref 0–44)
ANION GAP: 6 (ref 5–15)
AST: 19 U/L (ref 15–41)
Albumin: 4.3 g/dL (ref 3.5–5.0)
Alkaline Phosphatase: 54 U/L (ref 38–126)
BUN: 25 mg/dL — AB (ref 8–23)
CALCIUM: 9.8 mg/dL (ref 8.9–10.3)
CHLORIDE: 105 mmol/L (ref 98–111)
CO2: 28 mmol/L (ref 22–32)
Creatinine, Ser: 0.93 mg/dL (ref 0.61–1.24)
Glucose, Bld: 94 mg/dL (ref 70–99)
POTASSIUM: 4.5 mmol/L (ref 3.5–5.1)
SODIUM: 139 mmol/L (ref 135–145)
TOTAL PROTEIN: 7.1 g/dL (ref 6.5–8.1)
Total Bilirubin: 1.2 mg/dL (ref 0.3–1.2)

## 2017-10-29 LAB — SURGICAL PCR SCREEN
MRSA, PCR: NEGATIVE
Staphylococcus aureus: NEGATIVE

## 2017-10-29 LAB — APTT: aPTT: 30 seconds (ref 24–36)

## 2017-10-29 LAB — ABO/RH: ABO/RH(D): O POS

## 2017-10-29 LAB — PROTIME-INR
INR: 0.91
PROTHROMBIN TIME: 12.1 s (ref 11.4–15.2)

## 2017-10-29 NOTE — Pre-Procedure Instructions (Signed)
CMP results 10/29/17 faxed to Dr. Wynelle Link via epic.

## 2017-11-03 MED ORDER — BUPIVACAINE LIPOSOME 1.3 % IJ SUSP
20.0000 mL | INTRAMUSCULAR | Status: DC
  Filled 2017-11-03: qty 20

## 2017-11-04 ENCOUNTER — Ambulatory Visit (HOSPITAL_COMMUNITY): Payer: Medicare Other | Admitting: Anesthesiology

## 2017-11-04 ENCOUNTER — Encounter (HOSPITAL_COMMUNITY)
Admission: RE | Disposition: A | Discharge status: Home / Self Care | Payer: Self-pay | Source: Ambulatory Visit | Attending: Orthopedic Surgery

## 2017-11-04 ENCOUNTER — Observation Stay (HOSPITAL_COMMUNITY)
Admission: RE | Admit: 2017-11-04 | Discharge: 2017-11-05 | Disposition: A | Discharge status: Home / Self Care | Payer: Medicare Other | Source: Ambulatory Visit | Attending: Orthopedic Surgery | Admitting: Orthopedic Surgery

## 2017-11-04 ENCOUNTER — Other Ambulatory Visit: Payer: Self-pay

## 2017-11-04 ENCOUNTER — Encounter (HOSPITAL_COMMUNITY): Payer: Self-pay | Admitting: *Deleted

## 2017-11-04 DIAGNOSIS — Z79899 Other long term (current) drug therapy: Secondary | ICD-10-CM | POA: Diagnosis not present

## 2017-11-04 DIAGNOSIS — Z7982 Long term (current) use of aspirin: Secondary | ICD-10-CM | POA: Diagnosis not present

## 2017-11-04 DIAGNOSIS — I10 Essential (primary) hypertension: Secondary | ICD-10-CM | POA: Diagnosis not present

## 2017-11-04 DIAGNOSIS — I2510 Atherosclerotic heart disease of native coronary artery without angina pectoris: Secondary | ICD-10-CM | POA: Diagnosis not present

## 2017-11-04 DIAGNOSIS — M25762 Osteophyte, left knee: Secondary | ICD-10-CM | POA: Diagnosis not present

## 2017-11-04 DIAGNOSIS — E785 Hyperlipidemia, unspecified: Secondary | ICD-10-CM | POA: Diagnosis not present

## 2017-11-04 DIAGNOSIS — M1712 Unilateral primary osteoarthritis, left knee: Principal | ICD-10-CM | POA: Insufficient documentation

## 2017-11-04 DIAGNOSIS — Z87891 Personal history of nicotine dependence: Secondary | ICD-10-CM | POA: Diagnosis not present

## 2017-11-04 DIAGNOSIS — Z955 Presence of coronary angioplasty implant and graft: Secondary | ICD-10-CM | POA: Diagnosis not present

## 2017-11-04 DIAGNOSIS — G8918 Other acute postprocedural pain: Secondary | ICD-10-CM | POA: Diagnosis not present

## 2017-11-04 DIAGNOSIS — I252 Old myocardial infarction: Secondary | ICD-10-CM | POA: Insufficient documentation

## 2017-11-04 DIAGNOSIS — M1710 Unilateral primary osteoarthritis, unspecified knee: Secondary | ICD-10-CM

## 2017-11-04 DIAGNOSIS — M25562 Pain in left knee: Secondary | ICD-10-CM | POA: Diagnosis present

## 2017-11-04 DIAGNOSIS — M179 Osteoarthritis of knee, unspecified: Secondary | ICD-10-CM | POA: Diagnosis present

## 2017-11-04 HISTORY — PX: PARTIAL KNEE ARTHROPLASTY: SHX2174

## 2017-11-04 LAB — TYPE AND SCREEN
ABO/RH(D): O POS
ANTIBODY SCREEN: NEGATIVE

## 2017-11-04 SURGERY — ARTHROPLASTY, KNEE, UNICOMPARTMENTAL
Anesthesia: Spinal | Site: Knee | Laterality: Left

## 2017-11-04 MED ORDER — BUPIVACAINE LIPOSOME 1.3 % IJ SUSP
INTRAMUSCULAR | Status: DC | PRN
  Administered 2017-11-04: 20 mL

## 2017-11-04 MED ORDER — BISACODYL 10 MG RE SUPP: 10.0000 mg | Freq: Every day | RECTAL | Status: DC | PRN

## 2017-11-04 MED ORDER — METOCLOPRAMIDE HCL 5 MG PO TABS: 5.0000 mg | ORAL_TABLET | Freq: Three times a day (TID) | ORAL | Status: DC | PRN

## 2017-11-04 MED ORDER — SODIUM CHLORIDE 0.9 % IJ SOLN
INTRAMUSCULAR | Status: AC
  Filled 2017-11-04: qty 50

## 2017-11-04 MED ORDER — CEFAZOLIN SODIUM-DEXTROSE 2-4 GM/100ML-% IV SOLN
2.0000 g | INTRAVENOUS | Status: AC
  Administered 2017-11-04: 2 g via INTRAVENOUS
  Filled 2017-11-04: qty 100

## 2017-11-04 MED ORDER — HYDROMORPHONE HCL 1 MG/ML IJ SOLN
INTRAMUSCULAR | Status: AC
  Filled 2017-11-04: qty 1

## 2017-11-04 MED ORDER — FENTANYL CITRATE (PF) 100 MCG/2ML IJ SOLN
50.0000 ug | INTRAMUSCULAR | Status: DC
  Administered 2017-11-04: 50 ug via INTRAVENOUS
  Filled 2017-11-04: qty 2

## 2017-11-04 MED ORDER — FLEET ENEMA 7-19 GM/118ML RE ENEM: 1.0000 | ENEMA | Freq: Once | RECTAL | Status: DC | PRN

## 2017-11-04 MED ORDER — MIDAZOLAM HCL 2 MG/2ML IJ SOLN: 0.5000 mg | Freq: Once | INTRAMUSCULAR | Status: DC | PRN

## 2017-11-04 MED ORDER — OXYCODONE HCL 5 MG PO TABS
ORAL_TABLET | ORAL | Status: AC
  Filled 2017-11-04: qty 2

## 2017-11-04 MED ORDER — NITROGLYCERIN 0.4 MG SL SUBL: 0.4000 mg | SUBLINGUAL_TABLET | SUBLINGUAL | Status: DC | PRN

## 2017-11-04 MED ORDER — SODIUM CHLORIDE 0.9 % IJ SOLN
INTRAMUSCULAR | Status: DC | PRN
  Administered 2017-11-04: 30 mL

## 2017-11-04 MED ORDER — POLYETHYLENE GLYCOL 3350 17 G PO PACK: 17.0000 g | PACK | Freq: Every day | ORAL | Status: DC | PRN

## 2017-11-04 MED ORDER — BUPIVACAINE HCL (PF) 0.25 % IJ SOLN
INTRAMUSCULAR | Status: AC
  Filled 2017-11-04: qty 30

## 2017-11-04 MED ORDER — ONDANSETRON HCL 4 MG PO TABS
4.0000 mg | ORAL_TABLET | Freq: Four times a day (QID) | ORAL | Status: DC | PRN
  Administered 2017-11-05: 4 mg via ORAL
  Filled 2017-11-04: qty 1

## 2017-11-04 MED ORDER — BUPIVACAINE IN DEXTROSE 0.75-8.25 % IT SOLN
INTRATHECAL | Status: DC | PRN
  Administered 2017-11-04: 1.8 mL via INTRATHECAL

## 2017-11-04 MED ORDER — METHOCARBAMOL 500 MG PO TABS
500.0000 mg | ORAL_TABLET | Freq: Four times a day (QID) | ORAL | Status: DC | PRN
  Administered 2017-11-05: 500 mg via ORAL
  Filled 2017-11-04: qty 1

## 2017-11-04 MED ORDER — MENTHOL 3 MG MT LOZG: 1.0000 | LOZENGE | OROMUCOSAL | Status: DC | PRN

## 2017-11-04 MED ORDER — PROMETHAZINE HCL 25 MG/ML IJ SOLN: 6.2500 mg | INTRAMUSCULAR | Status: DC | PRN

## 2017-11-04 MED ORDER — MEPERIDINE HCL 50 MG/ML IJ SOLN: 6.2500 mg | INTRAMUSCULAR | Status: DC | PRN

## 2017-11-04 MED ORDER — 0.9 % SODIUM CHLORIDE (POUR BTL) OPTIME
TOPICAL | Status: DC | PRN
  Administered 2017-11-04: 1000 mL

## 2017-11-04 MED ORDER — CEFAZOLIN SODIUM-DEXTROSE 2-4 GM/100ML-% IV SOLN
2.0000 g | Freq: Four times a day (QID) | INTRAVENOUS | Status: AC
  Administered 2017-11-04 (×2): 2 g via INTRAVENOUS
  Filled 2017-11-04 (×2): qty 100

## 2017-11-04 MED ORDER — HYDROMORPHONE HCL 1 MG/ML IJ SOLN
0.2500 mg | INTRAMUSCULAR | Status: DC | PRN
  Administered 2017-11-04 (×4): 0.5 mg via INTRAVENOUS

## 2017-11-04 MED ORDER — HYDROMORPHONE HCL 1 MG/ML IJ SOLN
0.5000 mg | INTRAMUSCULAR | Status: DC | PRN
  Administered 2017-11-04: 1 mg via INTRAVENOUS

## 2017-11-04 MED ORDER — PROPOFOL 10 MG/ML IV BOLUS
INTRAVENOUS | Status: AC
  Filled 2017-11-04: qty 40

## 2017-11-04 MED ORDER — MIDAZOLAM HCL 2 MG/2ML IJ SOLN
1.0000 mg | INTRAMUSCULAR | Status: DC
  Filled 2017-11-04: qty 2

## 2017-11-04 MED ORDER — ONDANSETRON HCL 4 MG/2ML IJ SOLN
INTRAMUSCULAR | Status: AC
  Filled 2017-11-04: qty 2

## 2017-11-04 MED ORDER — METOCLOPRAMIDE HCL 5 MG/ML IJ SOLN: 5.0000 mg | Freq: Three times a day (TID) | INTRAMUSCULAR | Status: DC | PRN

## 2017-11-04 MED ORDER — OXYCODONE HCL 5 MG PO TABS
5.0000 mg | ORAL_TABLET | ORAL | Status: DC | PRN
  Administered 2017-11-04: 10 mg via ORAL
  Filled 2017-11-04: qty 2

## 2017-11-04 MED ORDER — METHOCARBAMOL 500 MG IVPB - SIMPLE MED
INTRAVENOUS | Status: AC
  Filled 2017-11-04: qty 50

## 2017-11-04 MED ORDER — PHENOL 1.4 % MT LIQD: 1.0000 | OROMUCOSAL | Status: DC | PRN

## 2017-11-04 MED ORDER — ACETAMINOPHEN 500 MG PO TABS
1000.0000 mg | ORAL_TABLET | Freq: Four times a day (QID) | ORAL | Status: AC
  Administered 2017-11-04 – 2017-11-05 (×4): 1000 mg via ORAL
  Filled 2017-11-04 (×4): qty 2

## 2017-11-04 MED ORDER — DOCUSATE SODIUM 100 MG PO CAPS
100.0000 mg | ORAL_CAPSULE | Freq: Two times a day (BID) | ORAL | Status: DC
  Administered 2017-11-04 – 2017-11-05 (×2): 100 mg via ORAL
  Filled 2017-11-04 (×2): qty 1

## 2017-11-04 MED ORDER — EPHEDRINE SULFATE-NACL 50-0.9 MG/10ML-% IV SOSY
PREFILLED_SYRINGE | INTRAVENOUS | Status: DC | PRN
  Administered 2017-11-04 (×3): 10 mg via INTRAVENOUS
  Administered 2017-11-04: 5 mg via INTRAVENOUS

## 2017-11-04 MED ORDER — CHLORHEXIDINE GLUCONATE 4 % EX LIQD: 60.0000 mL | Freq: Once | CUTANEOUS | Status: DC

## 2017-11-04 MED ORDER — LACTATED RINGERS IV SOLN
INTRAVENOUS | Status: DC
  Administered 2017-11-04: 07:00:00 via INTRAVENOUS

## 2017-11-04 MED ORDER — SODIUM CHLORIDE 0.9 % IV SOLN
INTRAVENOUS | Status: DC
  Administered 2017-11-04: 1000 mL via INTRAVENOUS
  Administered 2017-11-04: 15:00:00 via INTRAVENOUS

## 2017-11-04 MED ORDER — PROPOFOL 500 MG/50ML IV EMUL
INTRAVENOUS | Status: DC | PRN
  Administered 2017-11-04: 100 ug/kg/min via INTRAVENOUS

## 2017-11-04 MED ORDER — SIMVASTATIN 40 MG PO TABS
40.0000 mg | ORAL_TABLET | Freq: Every day | ORAL | Status: DC
  Administered 2017-11-04: 40 mg via ORAL
  Filled 2017-11-04: qty 1
  Filled 2017-11-04: qty 2

## 2017-11-04 MED ORDER — HYDROMORPHONE HCL 1 MG/ML IJ SOLN
INTRAMUSCULAR | Status: AC
  Administered 2017-11-04: 0.5 mg via INTRAVENOUS
  Filled 2017-11-04: qty 2

## 2017-11-04 MED ORDER — OXYCODONE HCL 5 MG PO TABS
10.0000 mg | ORAL_TABLET | ORAL | Status: DC | PRN
  Administered 2017-11-04: 15 mg via ORAL
  Administered 2017-11-04 – 2017-11-05 (×2): 10 mg via ORAL
  Administered 2017-11-05: 15 mg via ORAL
  Filled 2017-11-04: qty 3
  Filled 2017-11-04 (×2): qty 2
  Filled 2017-11-04 (×2): qty 3

## 2017-11-04 MED ORDER — METHOCARBAMOL 500 MG IVPB - SIMPLE MED
500.0000 mg | Freq: Four times a day (QID) | INTRAVENOUS | Status: DC | PRN
  Administered 2017-11-04: 500 mg via INTRAVENOUS
  Filled 2017-11-04: qty 50

## 2017-11-04 MED ORDER — PROPOFOL 10 MG/ML IV BOLUS
INTRAVENOUS | Status: AC
  Filled 2017-11-04: qty 20

## 2017-11-04 MED ORDER — DEXAMETHASONE SODIUM PHOSPHATE 10 MG/ML IJ SOLN
10.0000 mg | Freq: Once | INTRAMUSCULAR | Status: AC
  Administered 2017-11-05: 10 mg via INTRAVENOUS
  Filled 2017-11-04: qty 1

## 2017-11-04 MED ORDER — ROPIVACAINE HCL 7.5 MG/ML IJ SOLN
INTRAMUSCULAR | Status: DC | PRN
  Administered 2017-11-04: 20 mL via PERINEURAL

## 2017-11-04 MED ORDER — ENALAPRIL MALEATE 2.5 MG PO TABS
2.5000 mg | ORAL_TABLET | Freq: Every day | ORAL | Status: DC
  Administered 2017-11-04: 2.5 mg via ORAL
  Filled 2017-11-04: qty 1

## 2017-11-04 MED ORDER — RIVAROXABAN 10 MG PO TABS
10.0000 mg | ORAL_TABLET | Freq: Every day | ORAL | Status: DC
  Administered 2017-11-05: 10 mg via ORAL
  Filled 2017-11-04: qty 1

## 2017-11-04 MED ORDER — STERILE WATER FOR IRRIGATION IR SOLN
Status: DC | PRN
  Administered 2017-11-04: 2000 mL

## 2017-11-04 MED ORDER — DIPHENHYDRAMINE HCL 12.5 MG/5ML PO ELIX: 12.5000 mg | ORAL_SOLUTION | ORAL | Status: DC | PRN

## 2017-11-04 MED ORDER — ONDANSETRON HCL 4 MG/2ML IJ SOLN
4.0000 mg | Freq: Four times a day (QID) | INTRAMUSCULAR | Status: DC | PRN
  Administered 2017-11-04: 4 mg via INTRAVENOUS
  Filled 2017-11-04 (×2): qty 2

## 2017-11-04 MED ORDER — ACETAMINOPHEN 10 MG/ML IV SOLN
1000.0000 mg | Freq: Four times a day (QID) | INTRAVENOUS | Status: DC
  Administered 2017-11-04: 1000 mg via INTRAVENOUS
  Filled 2017-11-04: qty 100

## 2017-11-04 SURGICAL SUPPLY — 53 items
BAG DECANTER FOR FLEXI CONT (MISCELLANEOUS) IMPLANT
BAG ZIPLOCK 12X15 (MISCELLANEOUS) IMPLANT
BANDAGE ACE 6X5 VEL STRL LF (GAUZE/BANDAGES/DRESSINGS) ×2 IMPLANT
BEARING PERSONA SZ F 8 (MISCELLANEOUS) ×1 IMPLANT
BLADE SAW RECIPROCATING 77.5 (BLADE) ×2 IMPLANT
BLADE SAW SGTL 11.0X1.19X90.0M (BLADE) ×2 IMPLANT
BLADE SAW SGTL 13.0X1.19X90.0M (BLADE) ×2 IMPLANT
BOWL SMART MIX CTS (DISPOSABLE) ×2 IMPLANT
BUR OVAL CARBIDE 4.0 (BURR) ×2 IMPLANT
CEMENT HV SMART SET (Cement) ×2 IMPLANT
CLSR STERI-STRIP ANTIMIC 1/2X4 (GAUZE/BANDAGES/DRESSINGS) ×2 IMPLANT
COMPONENT FEMRL PERSONA SZ6 LT (MISCELLANEOUS) ×1 IMPLANT
COMPONENT TIB PERSONA SZ F LT (MISCELLANEOUS) ×1 IMPLANT
COVER SURGICAL LIGHT HANDLE (MISCELLANEOUS) ×2 IMPLANT
CUFF TOURN SGL QUICK 34 (TOURNIQUET CUFF) ×1
CUFF TRNQT CYL 34X4X40X1 (TOURNIQUET CUFF) ×1 IMPLANT
DECANTER SPIKE VIAL GLASS SM (MISCELLANEOUS) ×2 IMPLANT
DRSG ADAPTIC 3X8 NADH LF (GAUZE/BANDAGES/DRESSINGS) ×2 IMPLANT
DRSG PAD ABDOMINAL 8X10 ST (GAUZE/BANDAGES/DRESSINGS) ×2 IMPLANT
DURAPREP 26ML APPLICATOR (WOUND CARE) ×2 IMPLANT
ELECT REM PT RETURN 15FT ADLT (MISCELLANEOUS) ×2 IMPLANT
GAUZE SPONGE 4X4 12PLY STRL (GAUZE/BANDAGES/DRESSINGS) ×2 IMPLANT
GLOVE BIO SURGEON STRL SZ7 (GLOVE) ×2 IMPLANT
GLOVE BIO SURGEON STRL SZ8 (GLOVE) ×2 IMPLANT
GLOVE BIOGEL PI IND STRL 7.0 (GLOVE) ×1 IMPLANT
GLOVE BIOGEL PI IND STRL 8 (GLOVE) ×2 IMPLANT
GLOVE BIOGEL PI INDICATOR 7.0 (GLOVE) ×1
GLOVE BIOGEL PI INDICATOR 8 (GLOVE) ×2
GOWN STRL REUS W/TWL LRG LVL3 (GOWN DISPOSABLE) ×4 IMPLANT
GOWN STRL REUS W/TWL XL LVL3 (GOWN DISPOSABLE) ×2 IMPLANT
HANDPIECE INTERPULSE COAX TIP (DISPOSABLE) ×1
IMMOBILIZER KNEE 20 (SOFTGOODS) ×4 IMPLANT
IMMOBILIZER KNEE 20 THIGH 36 (SOFTGOODS) ×1 IMPLANT
MANIFOLD NEPTUNE II (INSTRUMENTS) ×2 IMPLANT
NDL SAFETY ECLIPSE 18X1.5 (NEEDLE) ×1 IMPLANT
NEEDLE HYPO 18GX1.5 SHARP (NEEDLE) ×1
PACK TOTAL KNEE CUSTOM (KITS) ×2 IMPLANT
PAD ABD 8X10 STRL (GAUZE/BANDAGES/DRESSINGS) ×2 IMPLANT
PADDING CAST COTTON 6X4 STRL (CAST SUPPLIES) ×2 IMPLANT
PERSONA PART KNEE SYS SZ 6 LT (MISCELLANEOUS) ×2
PERSONA PART KNEE SYS SZ F 8 (MISCELLANEOUS) ×2
PERSONA PART KNEE SYS SZ F LT (MISCELLANEOUS) ×2
POSITIONER SURGICAL ARM (MISCELLANEOUS) ×2 IMPLANT
SCREW HEADED 33MM KNEE (MISCELLANEOUS) ×4 IMPLANT
SCREW HEADED 48MM KNEE (MISCELLANEOUS) ×4 IMPLANT
SET HNDPC FAN SPRY TIP SCT (DISPOSABLE) ×1 IMPLANT
STRIP CLOSURE SKIN 1/2X4 (GAUZE/BANDAGES/DRESSINGS) ×4 IMPLANT
SUT MNCRL AB 4-0 PS2 18 (SUTURE) ×2 IMPLANT
SUT STRATAFIX 0 PDS 27 VIOLET (SUTURE) ×2
SUT VIC AB 2-0 CT1 27 (SUTURE) ×2
SUT VIC AB 2-0 CT1 TAPERPNT 27 (SUTURE) ×2 IMPLANT
SUTURE STRATFX 0 PDS 27 VIOLET (SUTURE) ×1 IMPLANT
SYR 50ML LL SCALE MARK (SYRINGE) ×2 IMPLANT

## 2017-11-04 NOTE — Anesthesia Procedure Notes (Signed)
Anesthesia [facility] Block: Adductor canal block   Pre-Anesthetic Checklist: ,, timeout performed, Correct Patient, Correct Site, Correct Laterality, Correct Procedure, Correct Position, site marked, Risks and benefits discussed,  Surgical consent,  Pre-op evaluation,  At surgeon's request and post-op pain management  Laterality: Left and Lower  Prep: chloraprep       Needles:  Injection technique: Single-shot  Needle Type: Echogenic Needle     Needle Length: 9cm  Needle Gauge: 21     Additional Needles:   Procedures:,,,, ultrasound used (permanent image in chart),,,,  Narrative:  Start time: 11/04/2017 7:51 AM End time: 11/04/2017 7:59 AM Injection made incrementally with aspirations every 5 mL.  Performed by: Personally  Anesthesiologist: Annye Asa, MD  Additional Notes: Pt identified in Holding room.  Monitors applied. Working IV access confirmed. Sterile prep L thigh.  #21ga ECHOgenic needle into adductor canal with US guidance.  20cc 0.75% ropivacaine injected incrementally after negative test dose.  Patient asymptomatic, VSS, no heme aspirated, tolerated well.  Jenita Seashore, MD

## 2017-11-04 NOTE — Op Note (Signed)
OPERATIVE REPORT-UNICOMPARTMENTAL ARTHROPLASTY  PREOPERATIVE DIAGNOSIS: Medial compartment osteoarthritis, Left knee  POSTOPERATIVE DIAGNOSIS: Medial compartment osteoarthritis, Left knee  PROCEDURE:Left knee medial unicompartmental arthroplasty. ([facility])  SURGEON: Gaynelle Arabian, MD   ASSISTANT: Theresa Duty, PA-C  ANESTHESIA:  Adductor canal block and spinal.   ESTIMATED BLOOD LOSS: Minimal.   DRAINS: Hemovac x1.   TOURNIQUET TIME:  Total Tourniquet Time Documented: Thigh (Left) - 38 minutes Total: Thigh (Left) - 38 minutes   COMPLICATIONS: None.   CONDITION: Stable to recovery.   BRIEF CLINICAL NOTE:  Zachary Barron is a 77 y.o. male , who has  significant isolated medial compartment arthritis of the Left knee. The patient has had nonoperative management including injections of cortisone and viscous supplements. Unfortunately, the pain persists.  Radiograph showed isolated medial compartment bone-on-bone arthritis  with normal-appearing patellofemoral and lateral compartments. The patient presents now for left knee unicompartmental arthroplasty.   PROCEDURE IN DETAIL: After successful administration of  Adductor canal block and spinal anesthetic, a tourniquet was placed high on the  Left thigh and the Left lower extremity prepped and draped in usual sterile fashion. Extremity was wrapped in an Esmarch, knee flexed, and tourniquet inflated to 300 mmHg.       A midline incision was made with a 10 blade through subcutaneous  tissue to the extensor mechanism. A fresh blade was used to make a  medial parapatellar arthrotomy. Soft tissue on the proximal medial  tibia subperiosteally elevated to the joint line with a knife and into  the semimembranosus bursa with a Cobb elevator. The patella was  subluxed laterally, and the knee flexed 90 degrees. The ACL was intact.  The marginal osteophytes on the medial femur and tibia were removed with  a rongeur. The medial meniscus  was also removed. The extramedullary tibial cutting guide was placed referencing Proximally at the medial aspect of the tibial tubercle and distally along the 2nd metatarsal axis. 4 degrees of posterior slope was dialed in and the block was pinned to remove 4 mm from the medial tibial surface.The cut is made with an oscillating saw and the cut bone removed.      The 8 mm spacer was then placed with the knee in extension for a stable fit in flexion and extension. The distal femoral cutting guide was attached to the spacer in extension and pinned to make the distal femur cut with an oscillating saw.  The trial femurs are placed and size 6 is best and is pinned in the proper position. The posterior and chamfer cuts are made with an oscillating saw.  The femoral preparation is completed with the drilling of the 2 lug holes. The trial size 6 femur is placed with excellent fit. The 8 mm spacer is placed and there is excellent balance through full range of motion.  The trial and the spacer are removed and tibia addressed.      The tibial sizer is placed and size F is most appropriate. The proximal tibia is prepared with the drill holes and keel for the size F. The size F implant is placed with excellent fit. The trials are removed and cut bone surfaces prepared with pulsatile lavage. The cement is mixed and once ready for implantation The size F tibia and size 6 femur are cemented into place and all extruded cement removed. The 8 mm insert is then placed into the tibial tray  and locked into position. The knee is placed through a full range of motion with excellent  stability.           I then injected the extensor mechanism, periosteum of  the femur and subcu tissues, a total of 20 mL of Exparel mixed with 30  mL of saline. Wound was copiously irrigated with saline solution, and the arthrotomy closed over a Hemovac drain with a running #0 Stratofix  suture. The subcutaneous was closed with  interrupted 2-0  Vicryl and subcuticular running 4-0 Monocryl. The drain  was hooked to suction. Incision cleaned and dried and Steri-Strips and  a bulky sterile dressing applied. The tourniquet was released after a  total time of 40 minutes. This was done after closing the extensor  mechanism. The wound was closed and a bulky sterile dressing was  applied. The operative limb was placed into a knee immobilizer, and the patient awakened and transported to recovery room in stable condition.       Please note that a surgical assistant was a medical necessity for this  procedure in order to perform it in a safe and expeditious manner.  Assistance was necessary for retracting vital ligaments, neurovascular  structures, as well as for proper positioning of the limb to allow for  appropriate bone cuts and appropriate placement of the prosthesis.    Zachary Barron Zachary Breeding, MD

## 2017-11-04 NOTE — Evaluation (Signed)
Physical Therapy Evaluation Patient Details Name: Zachary Barron MRN: [id number] DOB: [birth year] Today's Date: 11/04/2017   History of Present Illness  Pt is a 77 YO male s/p L UKR on 9/25. PMH includes contracture of palmar fascia, medial meniscus tear, HTN, LBP, MI, CAD. Surgical history includes L knee scope, L RTC repair.  Clinical Impression   Pt presents with nausea, L knee pain, decreased L knee ROM limited by pain, difficulty performing mobility tasks, and decreased tolerance for ambulation limited by nausea and L knee pain. Acute PT to address deficits. Pt able to take a few steps at EOB today, but could not ambulate further due to severe nausea. Will continue to progress mobility as able, and will continue to follow acutely.      Follow Up Recommendations Follow surgeon's recommendation for DC plan and follow-up therapies(OPPT)    Equipment Recommendations  None recommended by PT    Recommendations for Other Services       Precautions / Restrictions Precautions Precautions: Fall;Knee Restrictions Weight Bearing Restrictions: No Other Position/Activity Restrictions: WBAT       Mobility  Bed Mobility Overal bed mobility: Needs Assistance Bed Mobility: Supine to Sit     Supine to sit: Min assist;HOB elevated     General bed mobility comments: Min assist for LLE management, verbal cuing for sequencing to get to EOB. Pt with some nausea upon sitting EOB, subsided after approximately 1 minute.   Transfers Overall transfer level: Needs assistance Equipment used: Rolling walker (2 wheeled) Transfers: Sit to/from Stand Sit to Stand: Min guard;From elevated surface         General transfer comment: Min guard for safety, verbal cuing for hand placement. Pt attempted stepping, able to take 3 steps forward but limited by nausea. Pt wished to return to supine positioning.   Ambulation/Gait Ambulation/Gait assistance: (deferred due to nausea )               Stairs            Wheelchair Mobility    Modified Rankin (Stroke Patients Only)       Balance Overall balance assessment: Mild deficits observed, not formally tested                                           Pertinent Vitals/Pain Pain Assessment: 0-10 Pain Score: 7  Pain Location: L knee  Pain Descriptors / Indicators: Sore Pain Intervention(s): Limited activity within patient's tolerance;Ice applied;Repositioned;Monitored during session;Premedicated before session    Home Living Family/patient expects to be discharged to:: Private residence Living Arrangements: Spouse/significant other Available Help at Discharge: Family;Available PRN/intermittently Type of Home: House Home Access: Stairs to enter Entrance Stairs-Rails: Left Entrance Stairs-Number of Steps: 6 (2 steps, landing, then 3 more) Home Layout: Two level;Able to live on main level with bedroom/bathroom Home Equipment: Shower seat;Tub bench;Walker - 2 wheels;Cane - single point;Crutches      Prior Function Level of Independence: Independent               Hand Dominance   Dominant Hand: Left    Extremity/Trunk Assessment   Upper Extremity Assessment Upper Extremity Assessment: Overall WFL for tasks assessed    Lower Extremity Assessment Lower Extremity Assessment: Overall WFL for tasks assessed;LLE deficits/detail LLE Deficits / Details: difficulty performing SLR, quad sets, due to pain  LLE: Unable to fully assess due to pain  Communication   Communication: No difficulties  Cognition Arousal/Alertness: Awake/alert Behavior During Therapy: WFL for tasks assessed/performed Overall Cognitive Status: Within Functional Limits for tasks assessed                                        General Comments      Exercises Total Joint Exercises Ankle Circles/Pumps: AROM;Both;10 reps;Supine Quad Sets: AROM;Left;5 reps;Supine Heel Slides: AROM;Left;5  reps;Supine Goniometric ROM: knee AAROM approximately 5-60*, limited by pain    Assessment/Plan    PT Assessment Patient needs continued PT services  PT Problem List Decreased strength;Pain;Decreased range of motion;Decreased activity tolerance;Decreased knowledge of use of DME;Decreased balance;Decreased safety awareness;Decreased mobility       PT Treatment Interventions DME instruction;Therapeutic activities;Gait training;Patient/family education;Therapeutic exercise;Stair training;Balance training;Functional mobility training    PT Goals (Current goals can be found in the Care Plan section)  Acute Rehab PT Goals PT Goal Formulation: With patient Time For Goal Achievement: 11/18/17 Potential to Achieve Goals: Good    Frequency 7X/week   Barriers to discharge        Co-evaluation               AM-PAC PT "6 Clicks" Daily Activity  Outcome Measure Difficulty turning over in bed (including adjusting bedclothes, sheets and blankets)?: Unable Difficulty moving from lying on back to sitting on the side of the bed? : Unable Difficulty sitting down on and standing up from a chair with arms (e.g., wheelchair, bedside commode, etc,.)?: Unable Help needed moving to and from a bed to chair (including a wheelchair)?: A Little Help needed walking in hospital room?: A Little Help needed climbing 3-5 steps with a railing? : A Little 6 Click Score: 12    End of Session Equipment Utilized During Treatment: Gait belt Activity Tolerance: Other (comment);Patient limited by pain(nausea and pain ) Patient left: in bed;with bed alarm set;with family/visitor present;with call bell/phone within reach;with SCD's reapplied Nurse Communication: Mobility status PT Visit Diagnosis: Difficulty in walking, not elsewhere classified (R26.2);Other abnormalities of gait and mobility (R26.89)    Time: 1720-1755 PT Time Calculation (min) (ACUTE ONLY): 35 min   Charges:   PT Evaluation $PT Eval Low  Complexity: 1 Low PT Treatments $Therapeutic Activity: 8-22 mins        Zachary Barron, PT Acute Rehabilitation Services Pager [phone]  Office [phone]   Zachary Barron D Zachary Barron 11/04/2017, 7:21 PM

## 2017-11-04 NOTE — Anesthesia Preprocedure Evaluation (Addendum)
Anesthesia Evaluation  Patient identified by MRN, date of birth, ID band Patient awake    Reviewed: Allergy & Precautions, NPO status , Patient's Chart, lab work & pertinent test results  History of Anesthesia Complications Negative for: history of anesthetic complications  Airway Mallampati: II  TM Distance: >3 FB Neck ROM: Full    Dental  (+) Caps, Dental Advisory Given   Pulmonary former smoker (quit 1977),    breath sounds clear to auscultation       Cardiovascular hypertension, Pt. on medications (-) angina+ CAD and + Cardiac Stents (LAD x2, Cx)   Rhythm:Regular Rate:Normal  '16 Stress: Small apical scar. No ischemia '11 Nuclear Stress Test [facility] [place]): negative for ischemia. EF 67%, small area of anterior/septal infarction   Neuro/Psych negative neurological ROS     GI/Hepatic Neg liver ROS, GERD  Controlled,  Endo/Other  negative endocrine ROS  Renal/GU negative Renal ROS     Musculoskeletal  (+) Arthritis , Osteoarthritis,    Abdominal   Peds  Hematology negative hematology ROS (+)   Anesthesia Other Findings   Reproductive/Obstetrics                            Anesthesia Physical Anesthesia Plan  ASA: III  Anesthesia Plan: Spinal   Post-op Pain Management:  Regional for Post-op pain   Induction:   PONV Risk Score and Plan: 1 and Ondansetron  Airway Management Planned: Natural Airway and Nasal Cannula  Additional Equipment:   Intra-op Plan:   Post-operative Plan:   Informed Consent: I have reviewed the patients History and Physical, chart, labs and discussed the procedure including the risks, benefits and alternatives for the proposed anesthesia with the patient or authorized representative who has indicated his/her understanding and acceptance.   Dental advisory given  Plan Discussed with: CRNA and Surgeon  Anesthesia Plan Comments: (Plan routine monitors,  SAB with adductor canal block for post op analgesia)        Anesthesia Quick Evaluation

## 2017-11-04 NOTE — Transfer of Care (Signed)
Immediate Anesthesia Transfer of Care Note  Patient: Zachary Barron  Procedure(s) Performed: Left knee medial unicompartmental arthroplasty (Left Knee)  Patient Location: PACU  Anesthesia Type:Regional and Spinal  Level of Consciousness: awake, alert  and oriented  Airway & Oxygen Therapy: Patient Spontanous Breathing and Patient connected to face mask oxygen  Post-op Assessment: Report given to RN and Post -op Vital signs reviewed and stable  Post vital signs: Reviewed and stable  Last Vitals:  Vitals Value Taken Time  BP    Temp    Pulse 63 11/04/2017 10:03 AM  Resp    SpO2 95 % 11/04/2017 10:03 AM  Vitals shown include unvalidated device data.  Last Pain:  Vitals:   11/04/17 6160  TempSrc: Oral         Complications: No apparent anesthesia complications

## 2017-11-04 NOTE — Discharge Instructions (Addendum)
Dr. Gaynelle Arabian Total Joint Specialist [facility] [address] [place], [place] [address] [phone]  UNI KNEE REPLACEMENT POSTOPERATIVE DIRECTIONS  Knee Rehabilitation, Guidelines Following Surgery  Results after knee surgery are often greatly improved when you follow the exercise, range of motion and muscle strengthening exercises prescribed by your doctor. Safety measures are also important to protect the knee from further injury. Any time any of these exercises cause you to have increased pain or swelling in your knee joint, decrease the amount until you are comfortable again and slowly increase them. If you have problems or questions, call your caregiver or physical therapist for advice.   BLOOD CLOT PREVENTION Take Xarelto 10 mg daily for ten days. Then change to Aspirin 325 mg daily for two weeks.  Then resume one 81 mg aspirin once a day.  HOME CARE INSTRUCTIONS   Remove items at home which could result in a fall. This includes throw rugs or furniture in walking pathways.   ICE to the affected knee every three hours for 30 minutes at a time and then as needed for pain and swelling.  Continue to use ice on the knee for pain and swelling from surgery. You may notice swelling that will progress down to the foot and ankle.  This is normal after surgery.  Elevate the leg when you are not up walking on it.    Continue to use the breathing machine which will help keep your temperature down.  It is common for your temperature to cycle up and down following surgery, especially at night when you are not up moving around and exerting yourself.  The breathing machine keeps your lungs expanded and your temperature down.  Do not place pillow under knee, focus on keeping the knee straight while resting  DIET You may resume your previous home diet once your are discharged from the hospital.  DRESSING / WOUND CARE / SHOWERING You may shower 3 days after surgery, but  keep the wounds dry during showering.  You may use an occlusive plastic wrap (Press'n Seal for example), NO SOAKING/SUBMERGING IN THE BATHTUB.  If the bandage gets wet, change with a clean dry gauze.  If the incision gets wet, pat the wound dry with a clean towel. Leave the surgical dressing on the knee for 48 hours.  May remove the dressing on the second day.  Remove the Ace Wrap along with the cotton padding.  Leave the steri-strip bandaids in place along the incision on the skin.  Cover the incision each day with some dry gauze and paper tape. You may start showering once you are discharged home but do not submerge the incision under water. Just pat the incision dry and apply a dry gauze dressing on daily. Change the surgical dressing daily and reapply a dry dressing each time.  ACTIVITY Walk with your walker as instructed. Use walker as long as suggested by your caregivers. Avoid periods of inactivity such as sitting longer than an hour when not asleep. This helps prevent blood clots.  You may resume a sexual relationship in one month or when given the OK by your doctor.  You may return to work once you are cleared by your doctor.  Do not drive a car for 6 weeks or until released by you surgeon.  Do not drive while taking narcotics.  WEIGHT BEARING Weight bearing as tolerated with assist device (walker, cane, etc) as directed, use it as long as suggested by your surgeon  or therapist, typically at least 4-6 weeks.  POSTOPERATIVE CONSTIPATION PROTOCOL Constipation - defined medically as fewer than three stools per week and severe constipation as less than one stool per week.  One of the most common issues patients have following surgery is constipation.  Even if you have a regular bowel pattern at home, your normal regimen is likely to be disrupted due to multiple reasons following surgery.  Combination of anesthesia, postoperative narcotics, change in appetite and fluid intake all can affect  your bowels.  In order to avoid complications following surgery, here are some recommendations in order to help you during your recovery period.  Colace (docusate) - Pick up an over-the-counter form of Colace or another stool softener and take twice a day as long as you are requiring postoperative pain medications.  Take with a full glass of water daily.  If you experience loose stools or diarrhea, hold the colace until you stool forms back up.  If your symptoms do not get better within 1 week or if they get worse, check with your doctor.  Dulcolax (bisacodyl) - Pick up over-the-counter and take as directed by the product packaging as needed to assist with the movement of your bowels.  Take with a full glass of water.  Use this product as needed if not relieved by Colace only.   MiraLax (polyethylene glycol) - Pick up over-the-counter to have on hand.  MiraLax is a solution that will increase the amount of water in your bowels to assist with bowel movements.  Take as directed and can mix with a glass of water, juice, soda, coffee, or tea.  Take if you go more than two days without a movement. Do not use MiraLax more than once per day. Call your doctor if you are still constipated or irregular after using this medication for 7 days in a row.  If you continue to have problems with postoperative constipation, please contact the office for further assistance and recommendations.  If you experience "the worst abdominal pain ever" or develop nausea or vomiting, please contact the office immediatly for further recommendations for treatment.  ITCHING  If you experience itching with your medications, try taking only a single pain pill, or even half a pain pill at a time.  You can also use Benadryl over the counter for itching or also to help with sleep.   TED HOSE STOCKINGS Wear the elastic stockings on both legs for three weeks following surgery during the day but you may remove then at night for  sleeping.  MEDICATIONS See your medication summary on the After Visit Summary that the nursing staff will review with you prior to discharge.  You may have some home medications which will be placed on hold until you complete the course of blood thinner medication.  It is important for you to complete the blood thinner medication as prescribed by your surgeon.  Continue your approved medications as instructed at time of discharge.  PRECAUTIONS If you experience chest pain or shortness of breath - call 911 immediately for transfer to the hospital emergency department.  If you develop a fever greater that 101 F, purulent drainage from wound, increased redness or drainage from wound, foul odor from the wound/dressing, or calf pain - CONTACT YOUR SURGEON.  FOLLOW-UP APPOINTMENTS Make sure you keep all of your appointments after your operation with your surgeon and caregivers. You should call the office at the above phone number and make an appointment for approximately two weeks after the date of your surgery or on the date instructed by your surgeon outlined in the "After Visit Summary".  RANGE OF MOTION AND STRENGTHENING EXERCISES  Rehabilitation of the knee is important following a knee injury or an operation. After just a few days of immobilization, the muscles of the thigh which control the knee become weakened and shrink (atrophy). Knee exercises are designed to build up the tone and strength of the thigh muscles and to improve knee motion. Often times heat used for twenty to thirty minutes before working out will loosen up your tissues and help with improving the range of motion but do not use heat for the first two weeks following surgery. These exercises can be done on a training (exercise) mat, on the floor, on a table or on a bed. Use what ever works the best and is most comfortable for you Knee exercises include:   Leg Lifts - While your knee  is still immobilized in a splint or cast, you can do straight leg raises. Lift the leg to 60 degrees, hold for 3 sec, and slowly lower the leg. Repeat 10-20 times 2-3 times daily. Perform this exercise against resistance later as your knee gets better.   Quad and Hamstring Sets - Tighten up the muscle on the front of the thigh (Quad) and hold for 5-10 sec. Repeat this 10-20 times hourly. Hamstring sets are done by pushing the foot backward against an object and holding for 5-10 sec. Repeat as with quad sets.   Leg Slides: Lying on your back, slowly slide your foot toward your buttocks, bending your knee up off the floor (only go as far as is comfortable). Then slowly slide your foot back down until your leg is flat on the floor again.  Angel Wings: Lying on your back spread your legs to the side as far apart as you can without causing discomfort.  A rehabilitation program following serious knee injuries can speed recovery and prevent re-injury in the future due to weakened muscles. Contact your doctor or a physical therapist for more information on knee rehabilitation.   IF YOU ARE TRANSFERRED TO A SKILLED REHAB FACILITY If the patient is transferred to a skilled rehab facility following release from the hospital, a list of the current medications will be sent to the facility for the patient to continue.  When discharged from the skilled rehab facility, please have the facility set up the patient's [place] prior to being released. Also, the skilled facility will be responsible for providing the patient with their medications at time of release from the facility to include their pain medication, the muscle relaxants, and their blood thinner medication. If the patient is still at the rehab facility at time of the two week follow up appointment, the skilled rehab facility will also need to assist the patient in arranging follow up appointment in our office and any transportation  needs.  MAKE SURE YOU:   Understand these instructions.   Get help right away if you are not doing well or get worse.    Pick up stool softner and laxative for home use following surgery while on pain medications. Do not submerge incision under water. Please use good hand washing techniques while changing dressing each day. May shower  starting three days after surgery. Please use a clean towel to pat the incision dry following showers. Continue to use ice for pain and swelling after surgery. Do not use any lotions or creams on the incision until instructed by your surgeon.   Information on my medicine - XARELTO (Rivaroxaban) Why was Xarelto prescribed for you? Xarelto was prescribed for you to reduce the risk of blood clots forming after orthopedic surgery. The medical term for these abnormal blood clots is venous thromboembolism (VTE).  What do you need to know about xarelto ? Take your Xarelto ONCE DAILY at the same time every day. You may take it either with or without food.  If you have difficulty swallowing the tablet whole, you may crush it and mix in applesauce just prior to taking your dose.  Take Xarelto exactly as prescribed by your doctor and DO NOT stop taking Xarelto without talking to the doctor who prescribed the medication.  Stopping without other VTE prevention medication to take the place of Xarelto may increase your risk of developing a clot.  After discharge, you should have regular check-up appointments with your healthcare provider that is prescribing your Xarelto.    What do you do if you miss a dose? If you miss a dose, take it as soon as you remember on the same day then continue your regularly scheduled once daily regimen the next day. Do not take two doses of Xarelto on the same day.   Important Safety Information A possible side effect of Xarelto is bleeding. You should call your healthcare provider right away if you experience any of the  following: ? Bleeding from an injury or your nose that does not stop. ? Unusual colored urine (red or dark brown) or unusual colored stools (red or black). ? Unusual bruising for unknown reasons. ? A serious fall or if you hit your head (even if there is no bleeding).  Some medicines may interact with Xarelto and might increase your risk of bleeding while on Xarelto. To help avoid this, consult your healthcare provider or pharmacist prior to using any new prescription or non-prescription medications, including herbals, vitamins, non-steroidal anti-inflammatory drugs (NSAIDs) and supplements.  This website has more information on Xarelto: [email or web address]

## 2017-11-04 NOTE — Anesthesia Postprocedure Evaluation (Signed)
Anesthesia Post Note  Patient: Zachary Barron  Procedure(s) Performed: Left knee medial unicompartmental arthroplasty (Left Knee)     Patient location during evaluation: PACU Anesthesia Type: Spinal Level of consciousness: awake and alert, oriented and patient cooperative Pain management: pain level controlled Vital Signs Assessment: post-procedure vital signs reviewed and stable Respiratory status: spontaneous breathing, nonlabored ventilation and respiratory function stable Cardiovascular status: blood pressure returned to baseline and stable Postop Assessment: spinal receding, patient able to bend at knees and no apparent nausea or vomiting Anesthetic complications: no    Last Vitals:  Vitals:   11/04/17 1100 11/04/17 1159  BP: 127/69 132/78  Pulse: (!) 55 62  Resp: 12 12  Temp:  (!) 36.4 C  SpO2: 96% 94%    Last Pain:  Vitals:   11/04/17 1200  TempSrc:   PainSc: 3                  Dewell Monnier,E. Karmela Bram

## 2017-11-04 NOTE — Interval H&P Note (Signed)
History and Physical Interval Note:  11/04/2017 7:30 AM  Zachary Barron  has presented today for surgery, with the diagnosis of left knee medial compartment osteoarthritis  The various methods of treatment have been discussed with the patient and family. After consideration of risks, benefits and other options for treatment, the patient has consented to  Procedure(s): Left knee medial unicompartmental arthroplasty (Left) as a surgical intervention .  The patient's history has been reviewed, patient examined, no change in status, stable for surgery.  I have reviewed the patient's chart and labs.  Questions were answered to the patient's satisfaction.     Pilar Plate Deaken Jurgens

## 2017-11-04 NOTE — Anesthesia Procedure Notes (Signed)
Spinal  Patient location during procedure: OR Start time: 11/04/2017 8:24 AM End time: 11/04/2017 8:26 AM Staffing Resident/CRNA: Lind Covert, CRNA Preanesthetic Checklist Completed: patient identified, site marked, surgical consent, pre-op evaluation, timeout performed, IV checked, risks and benefits discussed and monitors and equipment checked Spinal Block Patient position: sitting Prep: DuraPrep Patient monitoring: heart rate, cardiac monitor, continuous pulse ox and blood pressure Approach: midline Location: L3-4 Injection technique: single-shot Needle Needle type: Pencan  Needle gauge: 24 G Needle length: 10 cm Needle insertion depth: 7 cm Assessment Sensory level: T6 Additional Notes Timeout performed. SAB kit date checked. SAB without difficulty.

## 2017-11-04 NOTE — Progress Notes (Signed)
Assisted Dr. Carswell Jackson with left, ultrasound guided, adductor canal block. Side rails up, monitors on throughout procedure. See vital signs in flow sheet. Tolerated Procedure well.  

## 2017-11-05 DIAGNOSIS — M1712 Unilateral primary osteoarthritis, left knee: Secondary | ICD-10-CM | POA: Diagnosis not present

## 2017-11-05 DIAGNOSIS — Z955 Presence of coronary angioplasty implant and graft: Secondary | ICD-10-CM | POA: Diagnosis not present

## 2017-11-05 DIAGNOSIS — I2510 Atherosclerotic heart disease of native coronary artery without angina pectoris: Secondary | ICD-10-CM | POA: Diagnosis not present

## 2017-11-05 DIAGNOSIS — Z7982 Long term (current) use of aspirin: Secondary | ICD-10-CM | POA: Diagnosis not present

## 2017-11-05 DIAGNOSIS — M25762 Osteophyte, left knee: Secondary | ICD-10-CM | POA: Diagnosis not present

## 2017-11-05 DIAGNOSIS — I252 Old myocardial infarction: Secondary | ICD-10-CM | POA: Diagnosis not present

## 2017-11-05 LAB — CBC
HCT: 38.4 % — ABNORMAL LOW (ref 39.0–52.0)
HEMOGLOBIN: 12.9 g/dL — AB (ref 13.0–17.0)
MCH: 31.5 pg (ref 26.0–34.0)
MCHC: 33.6 g/dL (ref 30.0–36.0)
MCV: 93.9 fL (ref 78.0–100.0)
PLATELETS: 229 10*3/uL (ref 150–400)
RBC: 4.09 MIL/uL — AB (ref 4.22–5.81)
RDW: 13.3 % (ref 11.5–15.5)
WBC: 8.5 10*3/uL (ref 4.0–10.5)

## 2017-11-05 LAB — BASIC METABOLIC PANEL
Anion gap: 7 (ref 5–15)
BUN: 13 mg/dL (ref 8–23)
CHLORIDE: 103 mmol/L (ref 98–111)
CO2: 26 mmol/L (ref 22–32)
CREATININE: 1 mg/dL (ref 0.61–1.24)
Calcium: 8.5 mg/dL — ABNORMAL LOW (ref 8.9–10.3)
Glucose, Bld: 126 mg/dL — ABNORMAL HIGH (ref 70–99)
POTASSIUM: 3.9 mmol/L (ref 3.5–5.1)
SODIUM: 136 mmol/L (ref 135–145)

## 2017-11-05 MED ORDER — METHOCARBAMOL 500 MG PO TABS: 500.0000 mg | ORAL_TABLET | Freq: Four times a day (QID) | ORAL | 0 refills | Status: DC | PRN

## 2017-11-05 MED ORDER — OXYCODONE HCL 5 MG PO TABS: 5.0000 mg | ORAL_TABLET | Freq: Four times a day (QID) | ORAL | 0 refills | Status: DC | PRN

## 2017-11-05 MED ORDER — SODIUM CHLORIDE 0.9 % IV BOLUS: 250.0000 mL | Freq: Once | INTRAVENOUS | Status: DC

## 2017-11-05 MED ORDER — RIVAROXABAN 10 MG PO TABS: 10.0000 mg | ORAL_TABLET | Freq: Every day | ORAL | 0 refills | Status: DC

## 2017-11-05 MED ORDER — SODIUM CHLORIDE 0.9 % IV BOLUS
250.0000 mL | Freq: Once | INTRAVENOUS | Status: AC
  Administered 2017-11-05: 250 mL via INTRAVENOUS

## 2017-11-05 NOTE — Care Management Obs Status (Signed)
[place] NOTIFICATION   Patient Details  Name: Zachary Barron MRN: [id number] Date of Birth: November 24, 1940   Medicare Observation Status Notification Given:  Yes    Guadalupe Maple, RN 11/05/2017, 11:42 AM

## 2017-11-05 NOTE — Progress Notes (Signed)
Physical Therapy Treatment Patient Details Name: Zachary Barron MRN: [id number] DOB: [birth year] Today's Date: 11/05/2017    History of Present Illness Pt is a 77 YO male s/p L UKR on 9/25. PMH includes contracture of palmar fascia, medial meniscus tear, HTN, LBP, MI, CAD. Surgical history includes L knee scope, L RTC repair.    PT Comments    The patient is mobilizing well. Did not use KI this visit. Will see patient in  PM for Steps. Plans   For DC today after PT.  Follow Up Recommendations  Follow surgeon's recommendation for DC plan and follow-up therapies;Outpatient PT     Equipment Recommendations  None recommended by PT    Recommendations for Other Services       Precautions / Restrictions Precautions Precautions: Fall;Knee Required Braces or Orthoses: Knee Immobilizer - Left Knee Immobilizer - Left: Discontinue once straight leg raise with < 10 degree lag Restrictions Other Position/Activity Restrictions: WBAT     Mobility  Bed Mobility Overal bed mobility: Needs Assistance Bed Mobility: Supine to Sit     Supine to sit: Min assist;HOB elevated     General bed mobility comments: Min assist for LLE management, verbal cuing for sequencing to get to EOB. Pt with some nausea upon sitting EOB, subsided after approximately 1 minute.   Transfers Overall transfer level: Needs assistance Equipment used: Rolling walker (2 wheeled) Transfers: Sit to/from Stand Sit to Stand: Min guard         General transfer comment: Min guard for safety, verbal cuing for hand placement and left leg position  Ambulation/Gait Ambulation/Gait assistance: Min assist Gait Distance (Feet): 100 Feet Assistive device: Rolling walker (2 wheeled) Gait Pattern/deviations: Step-to pattern;Step-through pattern     General Gait Details: cues for step length and sequencing, At times takes too long of a step with left   Stairs             Wheelchair Mobility    Modified Rankin  (Stroke Patients Only)       Balance                                            Cognition Arousal/Alertness: Awake/alert                                            Exercises Total Joint Exercises Ankle Circles/Pumps: AROM;Both;10 reps;Supine Quad Sets: AROM;Supine;10 reps;Both Heel Slides: AAROM;Left;10 reps;Supine Hip ABduction/ADduction: AROM;Left;10 reps;Supine Straight Leg Raises: AAROM;Left;10 reps;Supine Long Arc Quad: AROM;10 reps;Seated Knee Flexion: AROM;Left;10 reps;Seated Goniometric ROM: left knee 10-50 knee flexion seated    General Comments        Pertinent Vitals/Pain Pain Score: 4  Pain Location: L knee  Pain Descriptors / Indicators: Sore Pain Intervention(s): Monitored during session;Premedicated before session;Ice applied    Home Living                      Prior Function            PT Goals (current goals can now be found in the care plan section) Progress towards PT goals: Progressing toward goals    Frequency    7X/week      PT Plan Current plan remains appropriate    Co-evaluation  AM-PAC PT "6 Clicks" Daily Activity  Outcome Measure  Difficulty turning over in bed (including adjusting bedclothes, sheets and blankets)?: A Little Difficulty moving from lying on back to sitting on the side of the bed? : A Little Difficulty sitting down on and standing up from a chair with arms (e.g., wheelchair, bedside commode, etc,.)?: A Little Help needed moving to and from a bed to chair (including a wheelchair)?: A Little Help needed walking in hospital room?: A Lot Help needed climbing 3-5 steps with a railing? : A Lot 6 Click Score: 16    End of Session Equipment Utilized During Treatment: Gait belt Activity Tolerance: Patient tolerated treatment well Patient left: in chair;with call bell/phone within reach;with chair alarm set Nurse Communication: Mobility status PT Visit  Diagnosis: Unsteadiness on feet (R26.81)     Time: 8003-4917 PT Time Calculation (min) (ACUTE ONLY): 35 min  Charges:  $Gait Training: 8-22 mins $Therapeutic Exercise: 8-22 mins                     Tresa Endo PT Acute Rehabilitation Services Pager [phone] Office [phone]    Claretha Cooper 11/05/2017, 12:44 PM

## 2017-11-05 NOTE — Care Management Note (Signed)
Case Management Note  Patient Details  Name: Zachary Barron MRN: [id number] Date of Birth: May 02, 1940  Subjective/Objective:      Spoke with patient at bedside. Confirmed plan for OP PT, already arranged. Has RW and 3n1. [phone]              Action/Plan:   Expected Discharge Date:  11/05/17               Expected Discharge Plan:  OP Rehab  In-House Referral:  NA  Discharge planning Services  CM Consult  Post Acute Care Choice:  NA Choice offered to:  Patient  DME Arranged:  N/A DME Agency:  NA  HH Arranged:  NA HH Agency:  NA  Status of Service:  Completed, signed off  If discussed at [place] of Stay Meetings, dates discussed:    Additional Comments:  Guadalupe Maple, RN 11/05/2017, 11:10 AM

## 2017-11-05 NOTE — Progress Notes (Signed)
Physical Therapy Treatment Patient Details Name: Zachary Barron MRN: [id number] DOB: Jun 29, 1940 Today's Date: 11/05/2017    History of Present Illness Pt is a 77 YO male s/p L UKR on 9/25. PMH includes contracture of palmar fascia, medial meniscus tear, HTN, LBP, MI, CAD. Surgical history includes L knee scope, L RTC repair.    PT Comments    Patient  Practiced steps with family present and assisting. Patient  Experienced a nausea episode after therapy which resolved. . RN aware . Plans are for DC today.  Follow Up Recommendations  Follow surgeon's recommendation for DC plan and follow-up therapies;Outpatient PT     Equipment Recommendations  None recommended by PT    Recommendations for Other Services       Precautions / Restrictions Precautions Precautions: Fall;Knee Precaution Comments: inructed patient and family to use KI for steps. Required Braces or Orthoses: Knee Immobilizer - Left Knee Immobilizer - Left: Discontinue once straight leg raise with < 10 degree lag Restrictions Other Position/Activity Restrictions: WBAT     Mobility  Bed Mobility Overal bed mobility: Needs Assistance Bed Mobility: Supine to Sit     Supine to sit: Min assist;HOB elevated     General bed mobility comments: in rcliner  Transfers Overall transfer level: Needs assistance Equipment used: Rolling walker (2 wheeled) Transfers: Sit to/from Stand Sit to Stand: Min guard         General transfer comment: Min guard for safety, verbal cuing for hand placement and left leg position  Ambulation/Gait Ambulation/Gait assistance: Min guard Gait Distance (Feet): 80 Feet Assistive device: Rolling walker (2 wheeled) Gait Pattern/deviations: Step-through pattern;Step-to pattern     General Gait Details: cues for step length and sequencing, At times takes too long of a step with left   Stairs Stairs: Yes Stairs assistance: Min assist Stair Management: One rail Left;Step to  pattern;Forwards;With cane   General stair comments: practiced  x 2 up 2 then 3 with family assisting   Wheelchair Mobility    Modified Rankin (Stroke Patients Only)       Balance                                            Cognition Arousal/Alertness: Awake/alert                                            Exercises     General Comments        Pertinent Vitals/Pain Pain Score: 3  Pain Location: L knee  Pain Descriptors / Indicators: Sore Pain Intervention(s): Monitored during session;Premedicated before session    Home Living                      Prior Function            PT Goals (current goals can now be found in the care plan section) Progress towards PT goals: Progressing toward goals    Frequency    7X/week      PT Plan Current plan remains appropriate    Co-evaluation              AM-PAC PT "6 Clicks" Daily Activity  Outcome Measure  Difficulty turning over in bed (including adjusting bedclothes, sheets and blankets)?: A Little  Difficulty moving from lying on back to sitting on the side of the bed? : A Little Difficulty sitting down on and standing up from a chair with arms (e.g., wheelchair, bedside commode, etc,.)?: A Little Help needed moving to and from a bed to chair (including a wheelchair)?: A Little Help needed walking in hospital room?: A Little Help needed climbing 3-5 steps with a railing? : A Lot 6 Click Score: 17    End of Session Equipment Utilized During Treatment: Gait belt Activity Tolerance: Patient tolerated treatment well Patient left: in chair;with call bell/phone within reach Nurse Communication: Mobility status(patient had wave of nausea) PT Visit Diagnosis: Unsteadiness on feet (R26.81)     Time: 9470-9628 PT Time Calculation (min) (ACUTE ONLY): 39 min  Charges:  $Gait Training: 8-22 mins $Therapeutic Exercise: 8-22 mins $Self Care/Home Management: [place] Pager [phone] Office [phone]    Claretha Cooper 11/05/2017, 3:37 PM

## 2017-11-05 NOTE — Progress Notes (Signed)
   Subjective: 1 Day Post-Op Procedure(s) (LRB): Left knee medial unicompartmental arthroplasty (Left) Patient reports pain as mild.   Patient seen in rounds by Dr. Wynelle Link. Patient is well, and has had no acute complaints or problems other than nausea yesterday. Improved during rounds this AM. Denies chest pain, calf pain or SOB. Foley catheter removed this AM.  We will start therapy today.   Objective: Vital signs in last 24 hours: Temp:  [97.5 F (36.4 C)-98.6 F (37 C)] 98 F (36.7 C) (09/26 0536) Pulse Rate:  [55-83] 68 (09/26 0536) Resp:  [10-22] 16 (09/25 2221) BP: (104-152)/(53-108) 104/58 (09/26 0536) SpO2:  [94 %-100 %] 100 % (09/26 0536)  Intake/Output from previous day:  Intake/Output Summary (Last 24 hours) at 11/05/2017 0738 Last data filed at 11/05/2017 0600 Gross per 24 hour  Intake 3368.08 ml  Output 1410 ml  Net 1958.08 ml    Labs: Recent Labs    11/05/17 0453  HGB 12.9*   Recent Labs    11/05/17 0453  WBC 8.5  RBC 4.09*  HCT 38.4*  PLT 229   Recent Labs    11/05/17 0453  NA 136  K 3.9  CL 103  CO2 26  BUN 13  CREATININE 1.00  GLUCOSE 126*  CALCIUM 8.5*   Exam: General - Patient is Alert and Oriented Extremity - Neurologically intact Neurovascular intact Sensation intact distally Dorsiflexion/Plantar flexion intact Dressing - dressing C/D/I Motor Function - intact, moving foot and toes well on exam.   Past Medical History:  Diagnosis Date  . Arthritis   . Coronary artery disease   . Dupuytren contracture    Feet  . Fibroma   . GERD (gastroesophageal reflux disease)   . Hyperlipemia   . Hypertension   . LBP (low back pain)   . MI (myocardial infarction) ([place]) 2009   acute anterior infarct  . PAC (premature atrial contraction)    OCC  . Right BBB/left ant fasc block   . Stented coronary artery   . Tremor    intermittent resting tremor.    Assessment/Plan: 1 Day Post-Op Procedure(s) (LRB): Left knee medial  unicompartmental arthroplasty (Left) Principal Problem:   OA (osteoarthritis) of knee  Estimated body mass index is 28.49 kg/m as calculated from the following:   Height as of this encounter: 5\' 11"  (1.803 m).   Weight as of this encounter: 92.6 kg. Advance diet Up with therapy D/C IV fluids  DVT Prophylaxis - Xarelto Weight bearing as tolerated. D/C O2 and pulse ox and try on room air. Continue therapy.  Plan is to go Home after hospital stay.  BP soft this AM, 250 mL bolus ordered. Plan for discharge to home today with outpatient physical therapy. Follow-up in the office in 2 weeks with Dr. Wynelle Link.  Theresa Duty, PA-C Orthopedic Surgery 11/05/2017, 7:38 AM

## 2017-11-09 ENCOUNTER — Encounter (HOSPITAL_COMMUNITY): Payer: Self-pay | Admitting: Orthopedic Surgery

## 2017-11-09 DIAGNOSIS — M25562 Pain in left knee: Secondary | ICD-10-CM | POA: Diagnosis not present

## 2017-11-09 NOTE — Discharge Summary (Signed)
Physician Discharge Summary   Patient ID: Zachary Barron MRN: [id number] DOB/AGE: 1940-02-26 77 y.o.  Admit date: 11/04/2017 Discharge date: 11/05/2017  Primary Diagnosis: Medial compartment osteoarthritis, left knee   Admission Diagnoses:  Past Medical History:  Diagnosis Date  . Arthritis   . Coronary artery disease   . Dupuytren contracture    Feet  . Fibroma   . GERD (gastroesophageal reflux disease)   . Hyperlipemia   . Hypertension   . LBP (low back pain)   . MI (myocardial infarction) ([place]) 2009   acute anterior infarct  . PAC (premature atrial contraction)    OCC  . Right BBB/left ant fasc block   . Stented coronary artery   . Tremor    intermittent resting tremor.   Discharge Diagnoses:   Principal Problem:   OA (osteoarthritis) of knee  Estimated body mass index is 28.49 kg/m as calculated from the following:   Height as of this encounter: '5\' 11"'  (1.803 m).   Weight as of this encounter: 92.6 kg.  Procedure:  Procedure(s) (LRB): Left knee medial unicompartmental arthroplasty (Left)   Consults: None  HPI: Zachary Barron is a 77 y.o. male , who has significant isolated medial compartment arthritis of the Left knee. The patient has had nonoperative management including injections ofcortisone and viscous supplements. Unfortunately, the pain persists. Radiograph showed isolated medial compartment bone-on-bone arthritis with normal-appearing patellofemoral and lateral compartments. The patient presents now for left knee unicompartmental arthroplasty.  Laboratory Data: Admission on 11/04/2017, Discharged on 11/05/2017  Component Date Value Ref Range Status  . WBC 11/05/2017 8.5  4.0 - 10.5 K/uL Final  . RBC 11/05/2017 4.09* 4.22 - 5.81 MIL/uL Final  . Hemoglobin 11/05/2017 12.9* 13.0 - 17.0 g/dL Final  . HCT 11/05/2017 38.4* 39.0 - 52.0 % Final  . MCV 11/05/2017 93.9  78.0 - 100.0 fL Final  . MCH 11/05/2017 31.5  26.0 - 34.0 pg Final  . MCHC 11/05/2017 33.6   30.0 - 36.0 g/dL Final  . RDW 11/05/2017 13.3  11.5 - 15.5 % Final  . Platelets 11/05/2017 229  150 - 400 K/uL Final   Performed at [facility], [place] [address] [place], [place] [address]  . Sodium 11/05/2017 136  135 - 145 mmol/L Final  . Potassium 11/05/2017 3.9  3.5 - 5.1 mmol/L Final  . Chloride 11/05/2017 103  98 - 111 mmol/L Final  . CO2 11/05/2017 26  22 - 32 mmol/L Final  . Glucose, Bld 11/05/2017 126* 70 - 99 mg/dL Final  . BUN 11/05/2017 13  8 - 23 mg/dL Final  . Creatinine, Ser 11/05/2017 1.00  0.61 - 1.24 mg/dL Final  . Calcium 11/05/2017 8.5* 8.9 - 10.3 mg/dL Final  . GFR calc non Af Amer 11/05/2017 >60  >60 mL/min Final  . GFR calc Af Amer 11/05/2017 >60  >60 mL/min Final   Comment: (NOTE) The eGFR has been calculated using the CKD EPI equation. This calculation has not been validated in all clinical situations. eGFR's persistently <60 mL/min signify possible Chronic Kidney Disease.   Georgiann Hahn gap 11/05/2017 7  5 - 15 Final   Performed at [facility], [place] [address], [place], [place] [address]  Hospital Outpatient Visit on 10/29/2017  Component Date Value Ref Range Status  . aPTT 10/29/2017 30  24 - 36 seconds Final   Performed at [facility], [place] [address], [place],  [address]  . WBC 10/29/2017 6.3  4.0 - 10.5  K/uL Final  . RBC 10/29/2017 4.60  4.22 - 5.81 MIL/uL Final  . Hemoglobin 10/29/2017 14.7  13.0 - 17.0 g/dL Final  . HCT 10/29/2017 43.4  39.0 - 52.0 % Final  . MCV 10/29/2017 94.3  78.0 - 100.0 fL Final  . MCH 10/29/2017 32.0  26.0 - 34.0 pg Final  . MCHC 10/29/2017 33.9  30.0 - 36.0 g/dL Final  . RDW 10/29/2017 13.5  11.5 - 15.5 % Final  . Platelets 10/29/2017 274  150 - 400 K/uL Final   Performed at [facility], [place] [address], [place], [place] [address]  . Sodium 10/29/2017 139  135 - 145 mmol/L Final  . Potassium 10/29/2017 4.5  3.5 - 5.1 mmol/L Final  . Chloride  10/29/2017 105  98 - 111 mmol/L Final  . CO2 10/29/2017 28  22 - 32 mmol/L Final  . Glucose, Bld 10/29/2017 94  70 - 99 mg/dL Final  . BUN 10/29/2017 25* 8 - 23 mg/dL Final  . Creatinine, Ser 10/29/2017 0.93  0.61 - 1.24 mg/dL Final  . Calcium 10/29/2017 9.8  8.9 - 10.3 mg/dL Final  . Total Protein 10/29/2017 7.1  6.5 - 8.1 g/dL Final  . Albumin 10/29/2017 4.3  3.5 - 5.0 g/dL Final  . AST 10/29/2017 19  15 - 41 U/L Final  . ALT 10/29/2017 18  0 - 44 U/L Final  . Alkaline Phosphatase 10/29/2017 54  38 - 126 U/L Final  . Total Bilirubin 10/29/2017 1.2  0.3 - 1.2 mg/dL Final  . GFR calc non Af Amer 10/29/2017 >60  >60 mL/min Final  . GFR calc Af Amer 10/29/2017 >60  >60 mL/min Final   Comment: (NOTE) The eGFR has been calculated using the CKD EPI equation. This calculation has not been validated in all clinical situations. eGFR's persistently <60 mL/min signify possible Chronic Kidney Disease.   Georgiann Hahn gap 10/29/2017 6  5 - 15 Final   Performed at [facility], [place] [address] [place], [place] [address]  . Prothrombin Time 10/29/2017 12.1  11.4 - 15.2 seconds Final  . INR 10/29/2017 0.91   Final   Performed at [facility] [place], [place] [address] [place], [place] [address]  . ABO/RH(D) 10/29/2017 O POS   Final  . Antibody Screen 10/29/2017 NEG   Final  . Sample Expiration 10/29/2017 11/07/2017   Final  . Extend sample reason 10/29/2017    Final                   Value:NO TRANSFUSIONS OR PREGNANCY IN THE PAST 3 MONTHS Performed at [facility], [place] [address] [place], [place] [address]   . MRSA, PCR 10/29/2017 NEGATIVE  NEGATIVE Final  . Staphylococcus aureus 10/29/2017 NEGATIVE  NEGATIVE Final   Comment: (NOTE) The Xpert SA Assay (FDA approved for NASAL specimens in patients 14 years of age and older), is one component of a comprehensive surveillance program. It is not intended to diagnose infection nor to guide or monitor  treatment. Performed at [facility], [place] [address], [place], [place] [address]   . ABO/RH(D) 10/29/2017    Final                   Value:O POS Performed at [facility], [place] [address] [place],  [address]      X-Rays:No results found.  EKG: Orders placed or performed in visit on 03/11/17  . EKG 12-Lead  Hospital Course: Zachary Barron is a 77 y.o. who was admitted to [facility]. They were brought to the operating room on 11/04/2017 and underwent Procedure(s): Left knee medial unicompartmental arthroplasty.  Patient tolerated the procedure well and was later transferred to the recovery room and then to the orthopaedic floor for postoperative care.  They were given PO and IV analgesics for pain control following their surgery.  They were given 24 hours of postoperative antibiotics of  Anti-infectives (From admission, onward)   Start     Dose/Rate Route Frequency Ordered Stop   11/04/17 1600  ceFAZolin (ANCEF) IVPB 2g/100 mL premix     2 g 200 mL/hr over 30 Minutes Intravenous Every 6 hours 11/04/17 1104 11/04/17 2153   11/04/17 0700  ceFAZolin (ANCEF) IVPB 2g/100 mL premix     2 g 200 mL/hr over 30 Minutes Intravenous On call to O.R. 11/04/17 4097 11/04/17 3532     and started on DVT prophylaxis in the form of Xarelto.   PT and OT were ordered for postop therapy protocol.  Discharge planning consulted to help with postop disposition and equipment needs.  Patient had a decent night on the evening of surgery other than some complaints of nausea. They started to get up OOB with therapy on day one. Hemovac drain was pulled without difficulty. BP was noted to be soft, 250 mL bolus ordered. Patient was seen in rounds on day one and it was felt that as long as they did well with the remaining sessions of therapy that they would be ready to go home.  Arrangements were made and they were setup to go home on POD 1.  Diet - Cardiac  diet Follow up - in 2 weeks with Dr. Wynelle Link Activity - WBAT Dressing - May remove the surgical dressing tomorrow at home and then apply a dry gauze dressing daily. May shower three days following surgery but do not submerge the incision under water. Disposition - Home with outpatient physical therapy Condition Upon Discharge - Stable DVT Prophylaxis Xarelto 10 mg daily for ten days, then change to Aspirin 325 mg daily for two weeks, then reduce to Baby Aspirin 81 mg daily for three additional weeks.  Discharge Instructions    Call MD / Call 911   Complete by:  As directed    If you experience chest pain or shortness of breath, CALL 911 and be transported to the hospital emergency room.  If you develope a fever above 101 F, pus (white drainage) or increased drainage or redness at the wound, or calf pain, call your surgeon's office.   Change dressing   Complete by:  As directed    Change dressing on Friday (11/06/2017), then change the dressing daily with sterile 4 x 4 inch gauze dressing and apply TED hose.   Constipation Prevention   Complete by:  As directed    Drink plenty of fluids.  Prune juice may be helpful.  You may use a stool softener, such as Colace (over the counter) 100 mg twice a day.  Use MiraLax (over the counter) for constipation as needed.   Diet - low sodium heart healthy   Complete by:  As directed    Discharge instructions   Complete by:  As directed    Dr. Gaynelle Arabian Total Joint Specialist [facility] [address], [place], [place] [address] [phone]  UNI KNEE REPLACEMENT POSTOPERATIVE DIRECTIONS  Knee Rehabilitation, Guidelines Following Surgery  Results after knee surgery are often  greatly improved when you follow the exercise, range of motion and muscle strengthening exercises prescribed by your doctor. Safety measures are also important to protect the knee from further injury. Any time any of these exercises cause you to have increased  pain or swelling in your knee joint, decrease the amount until you are comfortable again and slowly increase them. If you have problems or questions, call your caregiver or physical therapist for advice.   BLOOD CLOT PREVENTION Take Xarelto 10 mg daily for ten days. Then change to Aspirin 325 mg daily for two weeks.  Then resume one 81 mg aspirin once a day.  HOME CARE INSTRUCTIONS  Remove items at home which could result in a fall. This includes throw rugs or furniture in walking pathways.  ICE to the affected knee every three hours for 30 minutes at a time and then as needed for pain and swelling.  Continue to use ice on the knee for pain and swelling from surgery. You may notice swelling that will progress down to the foot and ankle.  This is normal after surgery.  Elevate the leg when you are not up walking on it.   Continue to use the breathing machine which will help keep your temperature down.  It is common for your temperature to cycle up and down following surgery, especially at night when you are not up moving around and exerting yourself.  The breathing machine keeps your lungs expanded and your temperature down. Do not place pillow under knee, focus on keeping the knee straight while resting   DIET You may resume your previous home diet once your are discharged from the hospital.  DRESSING / WOUND CARE / SHOWERING You may shower 3 days after surgery, but keep the wounds dry during showering.  You may use an occlusive plastic wrap (Press'n Seal for example), NO SOAKING/SUBMERGING IN THE BATHTUB.  If the bandage gets wet, change with a clean dry gauze.  If the incision gets wet, pat the wound dry with a clean towel. Leave the surgical dressing on the knee for 48 hours.  May remove the dressing on the second day.  Remove the Ace Wrap along with the cotton padding.  Leave the steri-strip bandaids in place along the incision on the skin.  Cover the incision each day with some dry gauze and  paper tape. You may start showering once you are discharged home but do not submerge the incision under water. Just pat the incision dry and apply a dry gauze dressing on daily. Change the surgical dressing daily and reapply a dry dressing each time.  ACTIVITY Walk with your walker as instructed. Use walker as long as suggested by your caregivers. Avoid periods of inactivity such as sitting longer than an hour when not asleep. This helps prevent blood clots.  You may resume a sexual relationship in one month or when given the OK by your doctor.  You may return to work once you are cleared by your doctor.  Do not drive a car for 6 weeks or until released by you surgeon.  Do not drive while taking narcotics.  WEIGHT BEARING Weight bearing as tolerated with assist device (walker, cane, etc) as directed, use it as long as suggested by your surgeon or therapist, typically at least 4-6 weeks.  POSTOPERATIVE CONSTIPATION PROTOCOL Constipation - defined medically as fewer than three stools per week and severe constipation as less than one stool per week.  One of the most common issues patients  have following surgery is constipation.  Even if you have a regular bowel pattern at home, your normal regimen is likely to be disrupted due to multiple reasons following surgery.  Combination of anesthesia, postoperative narcotics, change in appetite and fluid intake all can affect your bowels.  In order to avoid complications following surgery, here are some recommendations in order to help you during your recovery period.  Colace (docusate) - Pick up an over-the-counter form of Colace or another stool softener and take twice a day as long as you are requiring postoperative pain medications.  Take with a full glass of water daily.  If you experience loose stools or diarrhea, hold the colace until you stool forms back up.  If your symptoms do not get better within 1 week or if they get worse, check with your  doctor.  Dulcolax (bisacodyl) - Pick up over-the-counter and take as directed by the product packaging as needed to assist with the movement of your bowels.  Take with a full glass of water.  Use this product as needed if not relieved by Colace only.   MiraLax (polyethylene glycol) - Pick up over-the-counter to have on hand.  MiraLax is a solution that will increase the amount of water in your bowels to assist with bowel movements.  Take as directed and can mix with a glass of water, juice, soda, coffee, or tea.  Take if you go more than two days without a movement. Do not use MiraLax more than once per day. Call your doctor if you are still constipated or irregular after using this medication for 7 days in a row.  If you continue to have problems with postoperative constipation, please contact the office for further assistance and recommendations.  If you experience "the worst abdominal pain ever" or develop nausea or vomiting, please contact the office immediatly for further recommendations for treatment.  ITCHING  If you experience itching with your medications, try taking only a single pain pill, or even half a pain pill at a time.  You can also use Benadryl over the counter for itching or also to help with sleep.   TED HOSE STOCKINGS Wear the elastic stockings on both legs for three weeks following surgery during the day but you may remove then at night for sleeping.  MEDICATIONS See your medication summary on the "After Visit Summary" that the nursing staff will review with you prior to discharge.  You may have some home medications which will be placed on hold until you complete the course of blood thinner medication.  It is important for you to complete the blood thinner medication as prescribed by your surgeon.  Continue your approved medications as instructed at time of discharge.  PRECAUTIONS If you experience chest pain or shortness of breath - call 911 immediately for transfer to the  hospital emergency department.  If you develop a fever greater that 101 F, purulent drainage from wound, increased redness or drainage from wound, foul odor from the wound/dressing, or calf pain - CONTACT YOUR SURGEON.                                                   FOLLOW-UP APPOINTMENTS Make sure you keep all of your appointments after your operation with your surgeon and caregivers. You should call the office at the above phone number  and make an appointment for approximately two weeks after the date of your surgery or on the date instructed by your surgeon outlined in the "After Visit Summary".  RANGE OF MOTION AND STRENGTHENING EXERCISES  Rehabilitation of the knee is important following a knee injury or an operation. After just a few days of immobilization, the muscles of the thigh which control the knee become weakened and shrink (atrophy). Knee exercises are designed to build up the tone and strength of the thigh muscles and to improve knee motion. Often times heat used for twenty to thirty minutes before working out will loosen up your tissues and help with improving the range of motion but do not use heat for the first two weeks following surgery. These exercises can be done on a training (exercise) mat, on the floor, on a table or on a bed. Use what ever works the best and is most comfortable for you Knee exercises include:  Leg Lifts - While your knee is still immobilized in a splint or cast, you can do straight leg raises. Lift the leg to 60 degrees, hold for 3 sec, and slowly lower the leg. Repeat 10-20 times 2-3 times daily. Perform this exercise against resistance later as your knee gets better.  Quad and Hamstring Sets - Tighten up the muscle on the front of the thigh (Quad) and hold for 5-10 sec. Repeat this 10-20 times hourly. Hamstring sets are done by pushing the foot backward against an object and holding for 5-10 sec. Repeat as with quad sets.  Leg Slides: Lying on your back,  slowly slide your foot toward your buttocks, bending your knee up off the floor (only go as far as is comfortable). Then slowly slide your foot back down until your leg is flat on the floor again. Angel Wings: Lying on your back spread your legs to the side as far apart as you can without causing discomfort.  A rehabilitation program following serious knee injuries can speed recovery and prevent re-injury in the future due to weakened muscles. Contact your doctor or a physical therapist for more information on knee rehabilitation.   IF YOU ARE TRANSFERRED TO A SKILLED REHAB FACILITY If the patient is transferred to a skilled rehab facility following release from the hospital, a list of the current medications will be sent to the facility for the patient to continue.  When discharged from the skilled rehab facility, please have the facility set up the patient's [place] prior to being released. Also, the skilled facility will be responsible for providing the patient with their medications at time of release from the facility to include their pain medication, the muscle relaxants, and their blood thinner medication. If the patient is still at the rehab facility at time of the two week follow up appointment, the skilled rehab facility will also need to assist the patient in arranging follow up appointment in our office and any transportation needs.  MAKE SURE YOU:  Understand these instructions.  Get help right away if you are not doing well or get worse.    Pick up stool softner and laxative for home use following surgery while on pain medications. Do not submerge incision under water. Please use good hand washing techniques while changing dressing each day. May shower starting three days after surgery. Please use a clean towel to pat the incision dry following showers. Continue to use ice for pain and swelling after surgery. Do not use any lotions or creams on  the incision until  instructed by your surgeon.   Do not put a pillow under the knee. Place it under the heel.   Complete by:  As directed    Driving restrictions   Complete by:  As directed    No driving for two weeks   TED hose   Complete by:  As directed    Use stockings (TED hose) for three weeks on both leg(s).  You may remove them at night for sleeping.   Weight bearing as tolerated   Complete by:  As directed      Allergies as of 11/05/2017      Reactions   Chlorhexidine Itching   Pt stated " itching after shower. Denies SOB. DENIES REDDNESS.       Medication List    STOP taking these medications   aspirin EC 81 MG tablet   BIOFREEZE EX   Vitamin D3 1000 units Caps     TAKE these medications   acetaminophen 500 MG tablet Commonly known as:  TYLENOL Take 1,000 mg by mouth every 6 (six) hours as needed (For pain.).   betamethasone dipropionate 0.05 % cream Commonly known as:  DIPROLENE Apply 1 application topically daily as needed (skin rash/irritation.).   diphenhydrAMINE 25 MG tablet Commonly known as:  BENADRYL Take 12.5 mg by mouth at bedtime.   enalapril 2.5 MG tablet Commonly known as:  VASOTEC TAKE 1 TABLET(2.5 MG) BY MOUTH DAILY What changed:  See the new instructions.   methocarbamol 500 MG tablet Commonly known as:  ROBAXIN Take 1 tablet (500 mg total) by mouth every 6 (six) hours as needed for muscle spasms.   nitroGLYCERIN 0.4 MG SL tablet Commonly known as:  NITROSTAT Place 1 tablet (0.4 mg total) under the tongue every 5 (five) minutes as needed for chest pain.   oxyCODONE 5 MG immediate release tablet Commonly known as:  Oxy IR/ROXICODONE Take 1-2 tablets (5-10 mg total) by mouth every 6 (six) hours as needed for moderate pain (pain score 4-6).   rivaroxaban 10 MG Tabs tablet Commonly known as:  XARELTO Take 1 tablet (10 mg total) by mouth daily with breakfast for 9 days. Then take one 325 mg aspirin once a day for two weeks. Then resume one 81 mg aspirin  once a day.   sertraline 50 MG tablet Commonly known as:  ZOLOFT Take 50 mg by mouth every other day. At bedtime   simvastatin 40 MG tablet Commonly known as:  ZOCOR Take 1 tablet (40 mg total) by mouth at bedtime. Please keep upcoming appt for future refills. Thank  you            Discharge Care Instructions  (From admission, onward)         Start     Ordered   11/05/17 0000  Weight bearing as tolerated     11/05/17 0745   11/05/17 0000  Change dressing    Comments:  Change dressing on Friday (11/06/2017), then change the dressing daily with sterile 4 x 4 inch gauze dressing and apply TED hose.   11/05/17 0745         Follow-up Information    Gaynelle Arabian, MD. Schedule an appointment as soon as possible for a visit on 11/19/2017.   Specialty:  Orthopedic Surgery Contact information: [address] [place] [address] [phone]           Signed: Theresa Duty, PA-C Orthopaedic Surgery 11/09/2017, 11:31 AM

## 2017-11-12 DIAGNOSIS — M25562 Pain in left knee: Secondary | ICD-10-CM | POA: Diagnosis not present

## 2017-11-17 DIAGNOSIS — M25562 Pain in left knee: Secondary | ICD-10-CM | POA: Diagnosis not present

## 2017-11-20 DIAGNOSIS — M25562 Pain in left knee: Secondary | ICD-10-CM | POA: Diagnosis not present

## 2017-11-24 DIAGNOSIS — M25562 Pain in left knee: Secondary | ICD-10-CM | POA: Diagnosis not present

## 2017-11-27 DIAGNOSIS — M25562 Pain in left knee: Secondary | ICD-10-CM | POA: Diagnosis not present

## 2017-12-01 DIAGNOSIS — M25562 Pain in left knee: Secondary | ICD-10-CM | POA: Diagnosis not present

## 2017-12-04 DIAGNOSIS — M25562 Pain in left knee: Secondary | ICD-10-CM | POA: Diagnosis not present

## 2017-12-08 DIAGNOSIS — M25562 Pain in left knee: Secondary | ICD-10-CM | POA: Diagnosis not present

## 2017-12-08 DIAGNOSIS — M1712 Unilateral primary osteoarthritis, left knee: Secondary | ICD-10-CM | POA: Diagnosis not present

## 2017-12-08 DIAGNOSIS — Z96652 Presence of left artificial knee joint: Secondary | ICD-10-CM | POA: Diagnosis not present

## 2017-12-08 DIAGNOSIS — Z471 Aftercare following joint replacement surgery: Secondary | ICD-10-CM | POA: Diagnosis not present

## 2017-12-11 DIAGNOSIS — M25562 Pain in left knee: Secondary | ICD-10-CM | POA: Diagnosis not present

## 2017-12-14 DIAGNOSIS — Z23 Encounter for immunization: Secondary | ICD-10-CM | POA: Diagnosis not present

## 2018-02-23 DIAGNOSIS — I2510 Atherosclerotic heart disease of native coronary artery without angina pectoris: Secondary | ICD-10-CM | POA: Diagnosis not present

## 2018-03-08 DIAGNOSIS — Z471 Aftercare following joint replacement surgery: Secondary | ICD-10-CM | POA: Diagnosis not present

## 2018-03-08 DIAGNOSIS — Z96652 Presence of left artificial knee joint: Secondary | ICD-10-CM | POA: Diagnosis not present

## 2018-03-18 DIAGNOSIS — M25562 Pain in left knee: Secondary | ICD-10-CM | POA: Diagnosis not present

## 2018-03-19 DIAGNOSIS — M25562 Pain in left knee: Secondary | ICD-10-CM | POA: Diagnosis not present

## 2018-03-22 DIAGNOSIS — M25562 Pain in left knee: Secondary | ICD-10-CM | POA: Diagnosis not present

## 2018-03-24 DIAGNOSIS — M25562 Pain in left knee: Secondary | ICD-10-CM | POA: Diagnosis not present

## 2018-03-26 DIAGNOSIS — M25562 Pain in left knee: Secondary | ICD-10-CM | POA: Diagnosis not present

## 2018-03-30 DIAGNOSIS — M25562 Pain in left knee: Secondary | ICD-10-CM | POA: Diagnosis not present

## 2018-04-02 ENCOUNTER — Telehealth: Payer: Self-pay | Admitting: Cardiovascular Disease

## 2018-04-02 NOTE — Telephone Encounter (Signed)
New Message   PT is calling because he would like to ask a question. He only wants to speak to a nurse or a PA about his question.  Please call

## 2018-04-02 NOTE — Telephone Encounter (Signed)
The patient requests his annual appointment with Dr.Cooper. Scheduled him 2/26 for 1 year evaluation. He was grateful for assistance.

## 2018-04-05 DIAGNOSIS — M25562 Pain in left knee: Secondary | ICD-10-CM | POA: Diagnosis not present

## 2018-04-07 ENCOUNTER — Ambulatory Visit (INDEPENDENT_AMBULATORY_CARE_PROVIDER_SITE_OTHER): Payer: Medicare Other | Admitting: Cardiovascular Disease

## 2018-04-07 ENCOUNTER — Encounter: Payer: Self-pay | Admitting: Cardiovascular Disease

## 2018-04-07 VITALS — BP 116/64 | HR 56 | Ht 71.0 in | Wt 200.1 lb

## 2018-04-07 DIAGNOSIS — I2510 Atherosclerotic heart disease of native coronary artery without angina pectoris: Secondary | ICD-10-CM

## 2018-04-07 DIAGNOSIS — E782 Mixed hyperlipidemia: Secondary | ICD-10-CM | POA: Diagnosis not present

## 2018-04-07 NOTE — Patient Instructions (Signed)
Medication Instructions:  Your provider recommends that you continue on your current medications as directed. Please refer to the Current Medication list given to you today.    Labwork: None  Testing/Procedures: None  Follow-Up: Your provider wants you to follow-up in: 1 year with Dr. Cooper or his assistant. You will receive a reminder letter in the mail two months in advance. If you don't receive a letter, please call our office to schedule the follow-up appointment.    

## 2018-04-07 NOTE — Progress Notes (Signed)
Cardiology Office Note:    Date:  04/07/2018   ID:  Zachary Barron, DOB 08/13/1940, MRN [id number]  PCP:  Lavone Orn, MD  Cardiologist:  Sherren Mocha, MD  Electrophysiologist:  None   Referring MD: Lavone Orn, MD   Chief Complaint  Patient presents with  . Coronary Artery Disease    History of Present Illness:    Zachary Barron is a 78 y.o. male with a hx of  coronary artery disease.  The patient initially presented in 2009 with an acute anterior infarct.  He has been treated with stenting of the LAD, left circumflex, and first diagonal branches.  He has been treated with both drug-eluting and bare metal stenting.  Zachary Barron is here alone today.  He has been doing well from a cardiac perspective.  He denies any symptoms of chest pain, shortness of breath, or heart palpitations.  He has been a little less active because of some problems with his left knee.  He denies any exertional cardiac-related symptoms.  He underwent a partial knee replacement in September 2019 and feels like he still recovering from this.  He reports good compliance with his medications.  Past Medical History:  Diagnosis Date  . Arthritis   . Coronary artery disease   . Dupuytren contracture    Feet  . Fibroma   . GERD (gastroesophageal reflux disease)   . Hyperlipemia   . Hypertension   . LBP (low back pain)   . MI (myocardial infarction) ([place]) 2009   acute anterior infarct  . PAC (premature atrial contraction)    OCC  . Right BBB/left ant fasc block   . Stented coronary artery   . Tremor    intermittent resting tremor.    Past Surgical History:  Procedure Laterality Date  . bilateral duproyn's contracture surgery     . CARDIAC CATHETERIZATION  12/01/2007  . CARDIOVASCULAR STRESS TEST  2011   Negative, in [place]  . CORONARY ANGIOPLASTY  2009   PCI circumflex and LAD  . CORONARY STENT PLACEMENT    . FASCIECTOMY Left 02/26/2016   Procedure: Segmental FASCIECTOMY left thumb, index and ring  fingers;  Surgeon: Daryll Brod, MD;  Location: [facility];  Service: Orthopedics;  Laterality: Left;  . KNEE ARTHROSCOPY Left 07/04/2015   Procedure: ARTHROSCOPY LEFT KNEE WITH MENSICAL DEBRIDEMENT;  Surgeon: Gaynelle Arabian, MD;  Location: [facility];  Service: Orthopedics;  Laterality: Left;  . left foot surgery    . left rotator cuff repair     . PARTIAL KNEE ARTHROPLASTY Left 11/04/2017   Procedure: Left knee medial unicompartmental arthroplasty;  Surgeon: Gaynelle Arabian, MD;  Location: [facility];  Service: Orthopedics;  Laterality: Left;    Current Medications: Current Meds  Medication Sig  . acetaminophen (TYLENOL) 500 MG tablet Take 1,000 mg by mouth every 6 (six) hours as needed (For pain.).   Marland Kitchen aspirin 81 MG chewable tablet Chew 81 mg by mouth daily.  . betamethasone dipropionate (DIPROLENE) 0.05 % cream Apply 1 application topically daily as needed (skin rash/irritation.).   Marland Kitchen diphenhydrAMINE (BENADRYL) 25 MG tablet Take 12.5 mg by mouth at bedtime.  . enalapril (VASOTEC) 2.5 MG tablet TAKE 1 TABLET(2.5 MG) BY MOUTH DAILY  . nitroGLYCERIN (NITROSTAT) 0.4 MG SL tablet Place 1 tablet (0.4 mg total) under the tongue every 5 (five) minutes as needed for chest pain.  Marland Kitchen sertraline (ZOLOFT) 50 MG tablet Take 50 mg by mouth every other day. At bedtime  . simvastatin (ZOCOR) 40 MG  tablet Take 1 tablet (40 mg total) by mouth at bedtime. Please keep upcoming appt for future refills. Thank  you     Allergies:   Chlorhexidine   Social History   Socioeconomic History  . Marital status: Married    Spouse name: Not on file  . Number of children: Not on file  . Years of education: Not on file  . Highest education level: Not on file  Occupational History  . Not on file  Social Needs  . Financial resource strain: Not on file  . Food insecurity:    Worry: Not on file    Inability: Not on file  . Transportation needs:    Medical: Not on file    Non-medical: Not on file  Tobacco Use    . Smoking status: Former Smoker    Last attempt to quit: 06/11/1975    Years since quitting: 42.8  . Smokeless tobacco: Never Used  Substance and Sexual Activity  . Alcohol use: Yes    Comment: occ glass of wine   . Drug use: No  . Sexual activity: Not on file  Lifestyle  . Physical activity:    Days per week: Not on file    Minutes per session: Not on file  . Stress: Not on file  Relationships  . Social connections:    Talks on phone: Not on file    Gets together: Not on file    Attends religious service: Not on file    Active member of club or organization: Not on file    Attends meetings of clubs or organizations: Not on file    Relationship status: Not on file  Other Topics Concern  . Not on file  Social History Narrative  . Not on file     Family History: The patient's family history includes Hypertension in his mother; Parkinsonism in an other family member.  ROS:   Please see the history of present illness.    All other systems reviewed and are negative.  EKGs/Labs/Other Studies Reviewed:    EKG:  EKG is ordered today.  The ekg ordered today demonstrates sinus bradycardia 56 bpm with right bundle branch block and first-degree AV block, no significant change from previous tracings.  Recent Labs: 10/29/2017: ALT 18 11/05/2017: BUN 13; Creatinine, Ser 1.00; Hemoglobin 12.9; Platelets 229; Potassium 3.9; Sodium 136  Recent Lipid Panel    Component Value Date/Time   CHOL 138 06/01/2014 1027   TRIG 56.0 06/01/2014 1027   HDL 66.30 06/01/2014 1027   CHOLHDL 2 06/01/2014 1027   VLDL 11.2 06/01/2014 1027   LDLCALC 61 06/01/2014 1027    Physical Exam:    VS:  BP 116/64   Pulse (!) 56   Ht 5\' 11"  (1.803 m)   Wt 200 lb 1.9 oz (90.8 kg)   SpO2 97%   BMI 27.91 kg/m     Wt Readings from Last 3 Encounters:  04/07/18 200 lb 1.9 oz (90.8 kg)  11/04/17 204 lb 4 oz (92.6 kg)  10/29/17 204 lb 4 oz (92.6 kg)     GEN:  Well nourished, well developed in no acute  distress HEENT: Normal NECK: No JVD; No carotid bruits LYMPHATICS: No lymphadenopathy CARDIAC: RRR, no murmurs, rubs, gallops RESPIRATORY:  Clear to auscultation without rales, wheezing or rhonchi  ABDOMEN: Soft, non-tender, non-distended MUSCULOSKELETAL:  No edema; No deformity  SKIN: Warm and dry NEUROLOGIC:  Alert and oriented x 3 PSYCHIATRIC:  Normal affect   ASSESSMENT:  1. Atherosclerosis of native coronary artery of native heart without angina pectoris   2. Mixed hyperlipidemia    PLAN:    In order of problems listed above:  1. The patient continues to do well many years out from multivessel PCI.  He is maintained on aspirin for antiplatelet therapy.  He is tolerating simvastatin for lipid lowering.  His lipids have been in range with an LDL cholesterol of 63 mg/dL and HDL of 68 mg/dL.  Total cholesterol is 148 and triglycerides are 85.  No changes are recommended in his medical regimen today.  He is going to relocate to [facility].  He is also followed closely by Dr. Posey Pronto at [facility].  I told him we can set up a follow-up visit in 1 year but if he decides to see me as needed, that would be fine.  He understands to call at any time if problems arise.   Medication Adjustments/Labs and Tests Ordered: Current medicines are reviewed at length with the patient today.  Concerns regarding medicines are outlined above.  Orders Placed This Encounter  Procedures  . EKG 12-Lead   No orders of the defined types were placed in this encounter.   Patient Instructions  Medication Instructions:  Your provider recommends that you continue on your current medications as directed. Please refer to the Current Medication list given to you today.    Labwork: None  Testing/Procedures: None  Follow-Up: Your provider wants you to follow-up in: 1 year with Dr. Burt Knack or his assistant. You will receive a reminder letter in the mail two months in advance. If you don't receive a letter, please  call our office to schedule the follow-up appointment.        Signed, Sherren Mocha, MD  04/07/2018 1:00 PM    [place]

## 2018-05-12 DIAGNOSIS — M1712 Unilateral primary osteoarthritis, left knee: Secondary | ICD-10-CM | POA: Diagnosis not present

## 2018-07-15 DIAGNOSIS — I252 Old myocardial infarction: Secondary | ICD-10-CM | POA: Diagnosis not present

## 2018-07-15 DIAGNOSIS — I2510 Atherosclerotic heart disease of native coronary artery without angina pectoris: Secondary | ICD-10-CM | POA: Diagnosis not present

## 2018-07-15 DIAGNOSIS — F419 Anxiety disorder, unspecified: Secondary | ICD-10-CM | POA: Diagnosis not present

## 2018-07-15 DIAGNOSIS — N401 Enlarged prostate with lower urinary tract symptoms: Secondary | ICD-10-CM | POA: Diagnosis not present

## 2018-07-15 DIAGNOSIS — Z1389 Encounter for screening for other disorder: Secondary | ICD-10-CM | POA: Diagnosis not present

## 2018-07-15 DIAGNOSIS — R351 Nocturia: Secondary | ICD-10-CM | POA: Diagnosis not present

## 2018-07-15 DIAGNOSIS — E7800 Pure hypercholesterolemia, unspecified: Secondary | ICD-10-CM | POA: Diagnosis not present

## 2018-07-15 DIAGNOSIS — Z0000 Encounter for general adult medical examination without abnormal findings: Secondary | ICD-10-CM | POA: Diagnosis not present

## 2018-07-16 DIAGNOSIS — M25562 Pain in left knee: Secondary | ICD-10-CM | POA: Diagnosis not present

## 2018-07-16 DIAGNOSIS — M1712 Unilateral primary osteoarthritis, left knee: Secondary | ICD-10-CM | POA: Diagnosis not present

## 2018-07-16 DIAGNOSIS — S83412D Sprain of medial collateral ligament of left knee, subsequent encounter: Secondary | ICD-10-CM | POA: Diagnosis not present

## 2018-07-19 DIAGNOSIS — E7800 Pure hypercholesterolemia, unspecified: Secondary | ICD-10-CM | POA: Diagnosis not present

## 2018-08-11 DIAGNOSIS — H2181 Floppy iris syndrome: Secondary | ICD-10-CM | POA: Diagnosis not present

## 2018-08-11 DIAGNOSIS — H40013 Open angle with borderline findings, low risk, bilateral: Secondary | ICD-10-CM | POA: Diagnosis not present

## 2018-08-11 DIAGNOSIS — H2513 Age-related nuclear cataract, bilateral: Secondary | ICD-10-CM | POA: Diagnosis not present

## 2018-08-11 DIAGNOSIS — H04123 Dry eye syndrome of bilateral lacrimal glands: Secondary | ICD-10-CM | POA: Diagnosis not present

## 2018-08-11 DIAGNOSIS — H5703 Miosis: Secondary | ICD-10-CM | POA: Diagnosis not present

## 2018-08-12 DIAGNOSIS — N401 Enlarged prostate with lower urinary tract symptoms: Secondary | ICD-10-CM | POA: Diagnosis not present

## 2018-08-12 DIAGNOSIS — M1712 Unilateral primary osteoarthritis, left knee: Secondary | ICD-10-CM | POA: Diagnosis not present

## 2018-08-12 DIAGNOSIS — I252 Old myocardial infarction: Secondary | ICD-10-CM | POA: Diagnosis not present

## 2018-08-12 DIAGNOSIS — I2510 Atherosclerotic heart disease of native coronary artery without angina pectoris: Secondary | ICD-10-CM | POA: Diagnosis not present

## 2018-10-01 ENCOUNTER — Encounter

## 2018-10-27 ENCOUNTER — Other Ambulatory Visit: Payer: Self-pay | Admitting: Dermatology

## 2018-10-27 DIAGNOSIS — D485 Neoplasm of uncertain behavior of skin: Secondary | ICD-10-CM | POA: Diagnosis not present

## 2018-10-27 DIAGNOSIS — L821 Other seborrheic keratosis: Secondary | ICD-10-CM | POA: Diagnosis not present

## 2018-10-27 DIAGNOSIS — L820 Inflamed seborrheic keratosis: Secondary | ICD-10-CM | POA: Diagnosis not present

## 2018-10-27 DIAGNOSIS — L570 Actinic keratosis: Secondary | ICD-10-CM | POA: Diagnosis not present

## 2018-10-27 DIAGNOSIS — D229 Melanocytic nevi, unspecified: Secondary | ICD-10-CM | POA: Diagnosis not present

## 2018-11-11 DIAGNOSIS — M25562 Pain in left knee: Secondary | ICD-10-CM | POA: Diagnosis not present

## 2018-11-11 DIAGNOSIS — Z23 Encounter for immunization: Secondary | ICD-10-CM | POA: Diagnosis not present

## 2018-11-11 DIAGNOSIS — T8484XD Pain due to internal orthopedic prosthetic devices, implants and grafts, subsequent encounter: Secondary | ICD-10-CM | POA: Diagnosis not present

## 2018-12-28 DIAGNOSIS — I2510 Atherosclerotic heart disease of native coronary artery without angina pectoris: Secondary | ICD-10-CM | POA: Diagnosis not present

## 2018-12-28 DIAGNOSIS — E7800 Pure hypercholesterolemia, unspecified: Secondary | ICD-10-CM | POA: Diagnosis not present

## 2018-12-28 DIAGNOSIS — M1712 Unilateral primary osteoarthritis, left knee: Secondary | ICD-10-CM | POA: Diagnosis not present

## 2018-12-28 DIAGNOSIS — N401 Enlarged prostate with lower urinary tract symptoms: Secondary | ICD-10-CM | POA: Diagnosis not present

## 2018-12-28 DIAGNOSIS — I252 Old myocardial infarction: Secondary | ICD-10-CM | POA: Diagnosis not present

## 2019-02-22 ENCOUNTER — Ambulatory Visit: Payer: Medicare Other | Attending: Internal Medicine

## 2019-02-22 DIAGNOSIS — Z23 Encounter for immunization: Secondary | ICD-10-CM

## 2019-02-22 NOTE — Progress Notes (Signed)
   Covid-19 Vaccination Clinic  Name:  Zachary Barron    MRN: [id number] DOB: 10-Jul-1940  02/22/2019  Mr. Zachary Barron was observed post Covid-19 immunization for 15 minutes without incidence. He was provided with Vaccine Information Sheet and instruction to access the V-Safe system.   Mr. Zachary Barron was instructed to call 911 with any severe reactions post vaccine: Marland Kitchen Difficulty breathing  . Swelling of your face and throat  . A fast heartbeat  . A bad rash all over your body  . Dizziness and weakness    Immunizations Administered    Name Date Dose VIS Date Route   Pfizer COVID-19 Vaccine 02/22/2019  9:03 AM 0.3 mL 01/21/2019 Intramuscular   Manufacturer: [place]   Lot: [id number]   [place]: [id number]

## 2019-03-01 DIAGNOSIS — I2510 Atherosclerotic heart disease of native coronary artery without angina pectoris: Secondary | ICD-10-CM | POA: Diagnosis not present

## 2019-03-01 DIAGNOSIS — Z87891 Personal history of nicotine dependence: Secondary | ICD-10-CM | POA: Diagnosis not present

## 2019-03-14 ENCOUNTER — Ambulatory Visit: Payer: Medicare Other | Attending: Internal Medicine

## 2019-03-14 DIAGNOSIS — Z23 Encounter for immunization: Secondary | ICD-10-CM | POA: Insufficient documentation

## 2019-03-14 NOTE — Progress Notes (Signed)
   Covid-19 Vaccination Clinic  Name:  Zachary Barron    MRN: [id number] DOB: 01-29-1941  03/14/2019  Zachary Barron was observed post Covid-19 immunization for 15 minutes without incidence. He was provided with Vaccine Information Sheet and instruction to access the V-Safe system.   Zachary Barron was instructed to call 911 with any severe reactions post vaccine: Marland Kitchen Difficulty breathing  . Swelling of your face and throat  . A fast heartbeat  . A bad rash all over your body  . Dizziness and weakness    Immunizations Administered    Name Date Dose VIS Date Route   Pfizer COVID-19 Vaccine 03/14/2019  8:26 AM 0.3 mL 01/21/2019 Intramuscular   Manufacturer: [employer]   Lot: [id number]   [place]: [id number]

## 2019-04-28 ENCOUNTER — Other Ambulatory Visit: Payer: Self-pay

## 2019-04-28 ENCOUNTER — Encounter: Payer: Self-pay | Admitting: Cardiovascular Disease

## 2019-04-28 ENCOUNTER — Ambulatory Visit (INDEPENDENT_AMBULATORY_CARE_PROVIDER_SITE_OTHER): Payer: Medicare Other | Admitting: Cardiovascular Disease

## 2019-04-28 VITALS — BP 120/74 | HR 66 | Ht 71.0 in | Wt 201.8 lb

## 2019-04-28 DIAGNOSIS — I2510 Atherosclerotic heart disease of native coronary artery without angina pectoris: Secondary | ICD-10-CM | POA: Diagnosis not present

## 2019-04-28 DIAGNOSIS — E782 Mixed hyperlipidemia: Secondary | ICD-10-CM | POA: Diagnosis not present

## 2019-04-28 NOTE — Progress Notes (Signed)
Cardiology Office Note:    Date:  04/28/2019   ID:  Zachary Barron, DOB 1940/12/07, MRN [id number]  PCP:  Lavone Orn, MD  Cardiologist:  Sherren Mocha, MD  Electrophysiologist:  None   Referring MD: Lavone Orn, MD   Chief Complaint  Patient presents with  . Coronary Artery Disease    History of Present Illness:    Zachary Barron is a 79 y.o. male with a hx of coronary artery disease. The patient initially presented in 2009 with an acute anterior infarct. He has been treated with stenting of the LAD, left circumflex, and first diagonal branches. He has been treated with both drug-eluting and bare metal stenting.  The patient continues to do very well.  He is in the process of retiring to [facility].  He still plans to spend about 2 days/week in [place].  I see him annually and he also follows with Dionne Ano at [facility].  The patient remained stable on his current medical program.  He is doing well with stress reduction, reporting good sleep at night.  He is trying to stick to an intermittent fasting diet based on the "obesity code."  He has gained about 1 pound over the last year based on our scales.  He denies chest pain, chest pressure, or shortness of breath.  He remains physically active with a regular exercise program and no exertional symptoms.  Past Medical History:  Diagnosis Date  . Arthritis   . Coronary artery disease   . Dupuytren contracture    Feet  . Fibroma   . GERD (gastroesophageal reflux disease)   . Hyperlipemia   . Hypertension   . LBP (low back pain)   . MI (myocardial infarction) ([place]) 2009   acute anterior infarct  . PAC (premature atrial contraction)    OCC  . Right BBB/left ant fasc block   . Stented coronary artery   . Tremor    intermittent resting tremor.    Past Surgical History:  Procedure Laterality Date  . bilateral duproyn's contracture surgery     . CARDIAC CATHETERIZATION  12/01/2007  . CARDIOVASCULAR STRESS TEST  2011   Negative, in [place]  . CORONARY ANGIOPLASTY  2009   PCI circumflex and LAD  . CORONARY STENT PLACEMENT    . FASCIECTOMY Left 02/26/2016   Procedure: Segmental FASCIECTOMY left thumb, index and ring fingers;  Surgeon: Daryll Brod, MD;  Location: [facility];  Service: Orthopedics;  Laterality: Left;  . KNEE ARTHROSCOPY Left 07/04/2015   Procedure: ARTHROSCOPY LEFT KNEE WITH MENSICAL DEBRIDEMENT;  Surgeon: Gaynelle Arabian, MD;  Location: [facility];  Service: Orthopedics;  Laterality: Left;  . left foot surgery    . left rotator cuff repair     . PARTIAL KNEE ARTHROPLASTY Left 11/04/2017   Procedure: Left knee medial unicompartmental arthroplasty;  Surgeon: Gaynelle Arabian, MD;  Location: [facility];  Service: Orthopedics;  Laterality: Left;    Current Medications: Current Meds  Medication Sig  . acetaminophen (TYLENOL) 500 MG tablet Take 1,000 mg by mouth every 6 (six) hours as needed (For pain.).   Marland Kitchen aspirin 81 MG chewable tablet Chew 81 mg by mouth daily.  . Cholecalciferol (VITAMIN D) 50 MCG (2000 UT) CAPS Take 1 capsule by mouth daily.  . enalapril (VASOTEC) 2.5 MG tablet TAKE 1 TABLET(2.5 MG) BY MOUTH DAILY  . nitroGLYCERIN (NITROSTAT) 0.4 MG SL tablet Place 1 tablet (0.4 mg total) under the tongue every 5 (five) minutes as needed for chest pain.  Marland Kitchen  sertraline (ZOLOFT) 50 MG tablet Take 25 mg by mouth every other day. At bedtime   . simvastatin (ZOCOR) 40 MG tablet Take 1 tablet (40 mg total) by mouth at bedtime. Please keep upcoming appt for future refills. Thank  you     Allergies:   Chlorhexidine   Social History   Socioeconomic History  . Marital status: Married    Spouse name: Not on file  . Number of children: Not on file  . Years of education: Not on file  . Highest education level: Not on file  Occupational History  . Not on file  Tobacco Use  . Smoking status: Former Smoker    Quit date: 06/11/1975    Years since quitting: 43.9  . Smokeless tobacco: Never Used    Substance and Sexual Activity  . Alcohol use: Yes    Comment: occ glass of wine   . Drug use: No  . Sexual activity: Not on file  Other Topics Concern  . Not on file  Social History Narrative  . Not on file   Social Determinants of Health   Financial Resource Strain:   . Difficulty of Paying Living Expenses:   Food Insecurity:   . Worried About Charity fundraiser in the Last Year:   . Arboriculturist in the Last Year:   Transportation Needs:   . Film/video editor (Medical):   Marland Kitchen Lack of Transportation (Non-Medical):   Physical Activity:   . Days of Exercise per Week:   . Minutes of Exercise per Session:   Stress:   . Feeling of Stress :   Social Connections:   . Frequency of Communication with Friends and Family:   . Frequency of Social Gatherings with Friends and Family:   . Attends Religious Services:   . Active Member of Clubs or Organizations:   . Attends Archivist Meetings:   Marland Kitchen Marital Status:      Family History: The patient's family history includes Hypertension in his mother; Parkinsonism in an other family member.  ROS:   Please see the history of present illness.    All other systems reviewed and are negative.  EKGs/Labs/Other Studies Reviewed:    EKG:  EKG is ordered today.  The ekg ordered today demonstrates normal sinus rhythm beats per minute, right bundle branch block, no change from previous tracings.  Recent Labs: No results found for requested labs within last 8760 hours.  Recent Lipid Panel    Component Value Date/Time   CHOL 138 06/01/2014 1027   TRIG 56.0 06/01/2014 1027   HDL 66.30 06/01/2014 1027   CHOLHDL 2 06/01/2014 1027   VLDL 11.2 06/01/2014 1027   LDLCALC 61 06/01/2014 1027    Physical Exam:    VS:  BP 120/74   Pulse 66   Ht 5\' 11"  (1.803 m)   Wt 201 lb 12.8 oz (91.5 kg)   SpO2 97%   BMI 28.15 kg/m     Wt Readings from Last 3 Encounters:  04/28/19 201 lb 12.8 oz (91.5 kg)  04/07/18 200 lb 1.9 oz (90.8  kg)  11/04/17 204 lb 4 oz (92.6 kg)     GEN:  Well nourished, well developed in no acute distress HEENT: Normal NECK: No JVD; No carotid bruits LYMPHATICS: No lymphadenopathy CARDIAC: RRR, no murmurs, rubs, gallops RESPIRATORY:  Clear to auscultation without rales, wheezing or rhonchi  ABDOMEN: Soft, non-tender, non-distended MUSCULOSKELETAL:  No edema; No deformity  SKIN: Warm and dry  NEUROLOGIC:  Alert and oriented x 3 PSYCHIATRIC:  Normal affect   ASSESSMENT:    1. Atherosclerosis of native coronary artery of native heart without angina pectoris   2. Mixed hyperlipidemia    PLAN:    In order of problems listed above:  1. The patient is stable with no symptoms of angina.  He remains on aspirin, a low-dose ACE inhibitor, and a statin drug.  No changes are recommended today. 2. Last lipids reviewed demonstrated cholesterol 148, HDL 68, LDL 67 mg/dL.  Lifestyle modification is discussed.  He will remain on simvastatin 40 mg for lipid-lowering.   Medication Adjustments/Labs and Tests Ordered: Current medicines are reviewed at length with the patient today.  Concerns regarding medicines are outlined above.  Orders Placed This Encounter  Procedures  . EKG 12-Lead   No orders of the defined types were placed in this encounter.   Patient Instructions  Medication Instructions:  Your provider recommends that you continue on your current medications as directed. Please refer to the Current Medication list given to you today.   *If you need a refill on your cardiac medications before your next appointment, please call your pharmacy*   Follow-Up: At [facility], you and your health needs are our priority.  As part of our continuing mission to provide you with exceptional heart care, we have created designated Provider Care Teams.  These Care Teams include your primary Cardiologist (physician) and Advanced Practice Providers (APPs -  Physician Assistants and Nurse Practitioners)  who all work together to provide you with the care you need, when you need it. Your next appointment:   12 month(s) The format for your next appointment:   In Person Provider:   You may see Sherren Mocha, MD or one of the following Advanced Practice Providers on your designated Care Team:    Richardson Dopp, PA-C  Vin Dec[place], PA-C  Daune Perch, [place]      Signed, Sherren Mocha, MD  04/28/2019 3:15 PM    [place]

## 2019-04-28 NOTE — Patient Instructions (Signed)

## 2019-07-18 DIAGNOSIS — N401 Enlarged prostate with lower urinary tract symptoms: Secondary | ICD-10-CM | POA: Diagnosis not present

## 2019-07-18 DIAGNOSIS — I2510 Atherosclerotic heart disease of native coronary artery without angina pectoris: Secondary | ICD-10-CM | POA: Diagnosis not present

## 2019-07-18 DIAGNOSIS — F419 Anxiety disorder, unspecified: Secondary | ICD-10-CM | POA: Diagnosis not present

## 2019-07-18 DIAGNOSIS — E7800 Pure hypercholesterolemia, unspecified: Secondary | ICD-10-CM | POA: Diagnosis not present

## 2019-07-18 DIAGNOSIS — Z0000 Encounter for general adult medical examination without abnormal findings: Secondary | ICD-10-CM | POA: Diagnosis not present

## 2019-07-18 DIAGNOSIS — Z1389 Encounter for screening for other disorder: Secondary | ICD-10-CM | POA: Diagnosis not present

## 2019-09-22 ENCOUNTER — Telehealth: Payer: Self-pay | Admitting: Cardiovascular Disease

## 2019-09-22 MED ORDER — NITROGLYCERIN 0.4 MG SL SUBL: 0.4000 mg | SUBLINGUAL_TABLET | SUBLINGUAL | 5 refills | Status: AC | PRN

## 2019-09-22 NOTE — Telephone Encounter (Signed)
   *  STAT* If patient is at the pharmacy, call can be transferred to refill team.   1. Which medications need to be refilled? (please list name of each medication and dose if known) nitroGLYCERIN (NITROSTAT) 0.4 MG SL tablet  2. Which pharmacy/location (including street and city if local pharmacy) is medication to be sent to? [facility] [place], [place] AT [address]  3. Do they need a 30 day or 90 day supply? 1 bottle

## 2019-09-22 NOTE — Telephone Encounter (Signed)
Pt's medication was sent to pt's pharmacy as requested. Confirmation received.  °

## 2019-09-23 DIAGNOSIS — K219 Gastro-esophageal reflux disease without esophagitis: Secondary | ICD-10-CM | POA: Diagnosis not present

## 2019-09-23 DIAGNOSIS — I2510 Atherosclerotic heart disease of native coronary artery without angina pectoris: Secondary | ICD-10-CM | POA: Diagnosis not present

## 2019-09-23 DIAGNOSIS — N401 Enlarged prostate with lower urinary tract symptoms: Secondary | ICD-10-CM | POA: Diagnosis not present

## 2019-09-23 DIAGNOSIS — E7800 Pure hypercholesterolemia, unspecified: Secondary | ICD-10-CM | POA: Diagnosis not present

## 2019-09-23 DIAGNOSIS — M1712 Unilateral primary osteoarthritis, left knee: Secondary | ICD-10-CM | POA: Diagnosis not present

## 2019-09-23 DIAGNOSIS — I252 Old myocardial infarction: Secondary | ICD-10-CM | POA: Diagnosis not present

## 2019-11-08 DIAGNOSIS — Z23 Encounter for immunization: Secondary | ICD-10-CM | POA: Diagnosis not present

## 2019-11-22 DIAGNOSIS — Z23 Encounter for immunization: Secondary | ICD-10-CM | POA: Diagnosis not present

## 2020-01-26 DIAGNOSIS — G4700 Insomnia, unspecified: Secondary | ICD-10-CM | POA: Diagnosis not present

## 2020-01-26 DIAGNOSIS — G4730 Sleep apnea, unspecified: Secondary | ICD-10-CM | POA: Diagnosis not present

## 2020-01-26 DIAGNOSIS — E663 Overweight: Secondary | ICD-10-CM | POA: Diagnosis not present

## 2020-01-26 DIAGNOSIS — F458 Other somatoform disorders: Secondary | ICD-10-CM | POA: Diagnosis not present

## 2020-01-31 DIAGNOSIS — G4733 Obstructive sleep apnea (adult) (pediatric): Secondary | ICD-10-CM | POA: Diagnosis not present

## 2020-02-01 DIAGNOSIS — G4733 Obstructive sleep apnea (adult) (pediatric): Secondary | ICD-10-CM | POA: Diagnosis not present

## 2020-02-16 ENCOUNTER — Encounter: Payer: Self-pay | Admitting: Cardiovascular Disease

## 2020-02-17 ENCOUNTER — Other Ambulatory Visit: Payer: Self-pay

## 2020-03-06 DIAGNOSIS — E785 Hyperlipidemia, unspecified: Secondary | ICD-10-CM | POA: Diagnosis not present

## 2020-03-19 DIAGNOSIS — H2513 Age-related nuclear cataract, bilateral: Secondary | ICD-10-CM | POA: Diagnosis not present

## 2020-03-19 DIAGNOSIS — H2181 Floppy iris syndrome: Secondary | ICD-10-CM | POA: Diagnosis not present

## 2020-03-19 DIAGNOSIS — H04123 Dry eye syndrome of bilateral lacrimal glands: Secondary | ICD-10-CM | POA: Diagnosis not present

## 2020-03-19 DIAGNOSIS — H43811 Vitreous degeneration, right eye: Secondary | ICD-10-CM | POA: Diagnosis not present

## 2020-03-19 DIAGNOSIS — H5703 Miosis: Secondary | ICD-10-CM | POA: Diagnosis not present

## 2020-03-19 DIAGNOSIS — H40013 Open angle with borderline findings, low risk, bilateral: Secondary | ICD-10-CM | POA: Diagnosis not present

## 2020-05-02 ENCOUNTER — Ambulatory Visit (INDEPENDENT_AMBULATORY_CARE_PROVIDER_SITE_OTHER): Payer: Medicare Other | Admitting: Physician Assistant

## 2020-05-02 ENCOUNTER — Other Ambulatory Visit: Payer: Self-pay

## 2020-05-02 ENCOUNTER — Encounter: Payer: Self-pay | Admitting: Physician Assistant

## 2020-05-02 VITALS — BP 128/74 | HR 60 | Ht 71.0 in | Wt 201.4 lb

## 2020-05-02 DIAGNOSIS — I2510 Atherosclerotic heart disease of native coronary artery without angina pectoris: Secondary | ICD-10-CM | POA: Diagnosis not present

## 2020-05-02 DIAGNOSIS — I10 Essential (primary) hypertension: Secondary | ICD-10-CM

## 2020-05-02 DIAGNOSIS — E782 Mixed hyperlipidemia: Secondary | ICD-10-CM | POA: Diagnosis not present

## 2020-05-02 NOTE — Progress Notes (Signed)
Cardiology Office Note:    Date:  05/02/2020   ID:  Zachary Barron, DOB 09-05-1940, MRN [id number]  PCP:  Lavone Orn, MD  [facility] Cardiologist:  Sherren Mocha, MD  [facility] Electrophysiologist:  None   Chief Complaint: yearly follow up   History of Present Illness:    Zachary Barron is a 80 y.o. male with a hx of CAD, HTN and HLD seen for follow up.   The patient initially presented in 2009 with an acute anterior infarct. He has been treated with stenting of the LAD, left circumflex, and first diagonal branches. He has been treated with both drug-eluting and bare metal stenting. Normal exercise myoview 06/2014.  Moved to [employer] in [place] from [place]. Also seeing cardiologist Dionne Ano, MD) at [facility], Last seen 03/06/2020/ Some anterior chest pain which felt related to stress.   Here today for follow-up.  He is chest pain has almost resolved with improved stress.  He does exercise for 45 to 60 minutes multiple days per week.  No exertional chest discomfort.  He denies shortness of breath, dizziness, palpitation, orthopnea, PND, syncope, lower extremity edema or melena.  Past Medical History:  Diagnosis Date  . Arthritis   . Coronary artery disease   . Dupuytren contracture    Feet  . Fibroma   . GERD (gastroesophageal reflux disease)   . Hyperlipemia   . Hypertension   . LBP (low back pain)   . MI (myocardial infarction) ([place]) 2009   acute anterior infarct  . PAC (premature atrial contraction)    OCC  . Right BBB/left ant fasc block   . Stented coronary artery   . Tremor    intermittent resting tremor.    Past Surgical History:  Procedure Laterality Date  . bilateral duproyn's contracture surgery     . CARDIAC CATHETERIZATION  12/01/2007  . CARDIOVASCULAR STRESS TEST  2011   Negative, in [place]  . CORONARY ANGIOPLASTY  2009   PCI circumflex and LAD  . CORONARY STENT PLACEMENT    . FASCIECTOMY Left 02/26/2016   Procedure:  Segmental FASCIECTOMY left thumb, index and ring fingers;  Surgeon: Daryll Brod, MD;  Location: [facility];  Service: Orthopedics;  Laterality: Left;  . KNEE ARTHROSCOPY Left 07/04/2015   Procedure: ARTHROSCOPY LEFT KNEE WITH MENSICAL DEBRIDEMENT;  Surgeon: Gaynelle Arabian, MD;  Location: [facility];  Service: Orthopedics;  Laterality: Left;  . left foot surgery    . left rotator cuff repair     . PARTIAL KNEE ARTHROPLASTY Left 11/04/2017   Procedure: Left knee medial unicompartmental arthroplasty;  Surgeon: Gaynelle Arabian, MD;  Location: [facility];  Service: Orthopedics;  Laterality: Left;    Current Medications: Current Meds  Medication Sig  . acetaminophen (TYLENOL) 500 MG tablet Take 1,000 mg by mouth every 6 (six) hours as needed (For pain.).   Marland Kitchen amoxicillin (AMOXIL) 500 MG capsule Take 4 capsules by mouth as needed. Take 1 hr before dental appointment  . aspirin 81 MG chewable tablet Chew 81 mg by mouth daily.  . Cholecalciferol (VITAMIN D) 50 MCG (2000 UT) CAPS Take 1 capsule by mouth daily.  . enalapril (VASOTEC) 5 MG tablet Take 1 tablet by mouth daily.  . nitroGLYCERIN (NITROSTAT) 0.4 MG SL tablet Place 1 tablet (0.4 mg total) under the tongue every 5 (five) minutes as needed for chest pain.  Marland Kitchen sertraline (ZOLOFT) 50 MG tablet Take 25 mg by mouth every other day. At bedtime  .  simvastatin (ZOCOR) 40 MG tablet Take 1 tablet (40 mg total) by mouth at bedtime. Please keep upcoming appt for future refills. Thank  you     Allergies:   Chlorhexidine   Social History   Socioeconomic History  . Marital status: Not on file    Spouse name: Not on file  . Number of children: Not on file  . Years of education: Not on file  . Highest education level: Not on file  Occupational History  . Not on file  Tobacco Use  . Smoking status: Former Smoker    Quit date: 06/11/1975    Years since quitting: 44.9  . Smokeless tobacco: Never Used  Vaping Use  . Vaping Use: Never used  Substance  and Sexual Activity  . Alcohol use: Yes    Comment: occ glass of wine   . Drug use: No  . Sexual activity: Not on file  Other Topics Concern  . Not on file  Social History Narrative   ** Merged History Encounter **       Social Determinants of Health   Financial Resource Strain: Not on file  Food Insecurity: Not on file  Transportation Needs: Not on file  Physical Activity: Not on file  Stress: Not on file  Social Connections: Not on file     Family History: The patient's family history includes Hypertension in his mother; Parkinsonism in an other family member.    ROS:   Please see the history of present illness.    All other systems reviewed and are negative.   EKGs/Labs/Other Studies Reviewed:    The following studies were reviewed today:  AS above   EKG:  EKG is ordered today.  The ekg ordered today demonstrates normal sinus rhythm, chronic right bundle branch block  Recent Labs: No results found for requested labs within last 8760 hours.  Recent Lipid Panel    Component Value Date/Time   CHOL 138 06/01/2014 1027   TRIG 56.0 06/01/2014 1027   HDL 66.30 06/01/2014 1027   CHOLHDL 2 06/01/2014 1027   VLDL 11.2 06/01/2014 1027   LDLCALC 61 06/01/2014 1027    Physical Exam:    VS:  BP 128/74   Pulse 60   Ht 5\' 11"  (1.803 m)   Wt 201 lb 6.4 oz (91.4 kg)   SpO2 100%   BMI 28.09 kg/m     Wt Readings from Last 3 Encounters:  05/02/20 201 lb 6.4 oz (91.4 kg)  04/28/19 201 lb 12.8 oz (91.5 kg)  04/07/18 200 lb 1.9 oz (90.8 kg)     GEN: Well nourished, well developed in no acute distress HEENT: Normal NECK: No JVD; No carotid bruits LYMPHATICS: No lymphadenopathy CARDIAC: RRR, no murmurs, rubs, gallops RESPIRATORY:  Clear to auscultation without rales, wheezing or rhonchi  ABDOMEN: Soft, non-tender, non-distended MUSCULOSKELETAL:  No edema; No deformity  SKIN: Warm and dry NEUROLOGIC:  Alert and oriented x 3 PSYCHIATRIC:  Normal affect   ASSESSMENT  AND PLAN:    1. CAD s/p remote multiple stent Last exercise Myoview in 2016 reassuring.  Recent chest pain likely related to stress due to moving out of city.  Now improved with almost resolution.  Has no exertional symptoms.  Continue current medical therapy with aspirin and statin.  2.  Hypertension Blood pressure stable on enalapril  3.  Hyperlipidemia -Continue statin -LDL 68 on 07/2019  Medication Adjustments/Labs and Tests Ordered: Current medicines are reviewed at length with the patient today.  Concerns  regarding medicines are outlined above.  Orders Placed This Encounter  Procedures  . EKG 12-Lead   No orders of the defined types were placed in this encounter.   Patient Instructions  Medication Instructions:  Your physician recommends that you continue on your current medications as directed. Please refer to the Current Medication list given to you today.  *If you need a refill on your cardiac medications before your next appointment, please call your pharmacy*   Lab Work: None ordered  If you have labs (blood work) drawn today and your tests are completely normal, you will receive your results only by: Marland Kitchen MyChart Message (if you have MyChart) OR . A paper copy in the mail If you have any lab test that is abnormal or we need to change your treatment, we will call you to review the results.   Testing/Procedures: None ordered   Follow-Up: At [facility], you and your health needs are our priority.  As part of our continuing mission to provide you with exceptional heart care, we have created designated Provider Care Teams.  These Care Teams include your primary Cardiologist (physician) and Advanced Practice Providers (APPs -  Physician Assistants and Nurse Practitioners) who all work together to provide you with the care you need, when you need it.  We recommend signing up for the patient portal called "MyChart".  Sign up information is provided on this After Visit  Summary.  MyChart is used to connect with patients for Virtual Visits (Telemedicine).  Patients are able to view lab/test results, encounter notes, upcoming appointments, etc.  Non-urgent messages can be sent to your provider as well.   To learn more about what you can do with MyChart, go to [email or web address].    Your next appointment:   12 month(s)  The format for your next appointment:   In Person  Provider:   You may see Sherren Mocha, MD or one of the following Advanced Practice Providers on your designated Care Team:    Richardson Dopp, PA-C  Robbie Lis, PA-C    Other Instructions      Signed, Leanor Kail, [place]  05/02/2020 10:14 AM    [place]

## 2020-05-02 NOTE — Patient Instructions (Signed)

## 2020-05-09 DIAGNOSIS — I2510 Atherosclerotic heart disease of native coronary artery without angina pectoris: Secondary | ICD-10-CM | POA: Diagnosis not present

## 2020-05-09 DIAGNOSIS — K219 Gastro-esophageal reflux disease without esophagitis: Secondary | ICD-10-CM | POA: Diagnosis not present

## 2020-05-09 DIAGNOSIS — I252 Old myocardial infarction: Secondary | ICD-10-CM | POA: Diagnosis not present

## 2020-05-09 DIAGNOSIS — E7800 Pure hypercholesterolemia, unspecified: Secondary | ICD-10-CM | POA: Diagnosis not present

## 2020-05-09 DIAGNOSIS — N401 Enlarged prostate with lower urinary tract symptoms: Secondary | ICD-10-CM | POA: Diagnosis not present

## 2020-05-09 DIAGNOSIS — M1712 Unilateral primary osteoarthritis, left knee: Secondary | ICD-10-CM | POA: Diagnosis not present

## 2020-06-18 DIAGNOSIS — I2510 Atherosclerotic heart disease of native coronary artery without angina pectoris: Secondary | ICD-10-CM | POA: Diagnosis not present

## 2020-06-18 DIAGNOSIS — E7800 Pure hypercholesterolemia, unspecified: Secondary | ICD-10-CM | POA: Diagnosis not present

## 2020-06-18 DIAGNOSIS — N401 Enlarged prostate with lower urinary tract symptoms: Secondary | ICD-10-CM | POA: Diagnosis not present

## 2020-06-18 DIAGNOSIS — K219 Gastro-esophageal reflux disease without esophagitis: Secondary | ICD-10-CM | POA: Diagnosis not present

## 2020-06-18 DIAGNOSIS — I252 Old myocardial infarction: Secondary | ICD-10-CM | POA: Diagnosis not present

## 2020-06-18 DIAGNOSIS — M1712 Unilateral primary osteoarthritis, left knee: Secondary | ICD-10-CM | POA: Diagnosis not present

## 2020-06-18 DIAGNOSIS — I10 Essential (primary) hypertension: Secondary | ICD-10-CM | POA: Diagnosis not present

## 2020-07-06 DIAGNOSIS — U071 COVID-19: Secondary | ICD-10-CM | POA: Diagnosis not present

## 2020-07-31 DIAGNOSIS — I10 Essential (primary) hypertension: Secondary | ICD-10-CM | POA: Diagnosis not present

## 2020-08-31 DIAGNOSIS — L570 Actinic keratosis: Secondary | ICD-10-CM | POA: Diagnosis not present

## 2020-08-31 DIAGNOSIS — X32XXXA Exposure to sunlight, initial encounter: Secondary | ICD-10-CM | POA: Diagnosis not present

## 2020-08-31 DIAGNOSIS — L821 Other seborrheic keratosis: Secondary | ICD-10-CM | POA: Diagnosis not present

## 2020-08-31 DIAGNOSIS — M720 Palmar fascial fibromatosis [Dupuytren]: Secondary | ICD-10-CM | POA: Diagnosis not present

## 2020-08-31 DIAGNOSIS — Z7189 Other specified counseling: Secondary | ICD-10-CM | POA: Diagnosis not present

## 2020-08-31 DIAGNOSIS — B351 Tinea unguium: Secondary | ICD-10-CM | POA: Diagnosis not present

## 2020-08-31 DIAGNOSIS — S30861A Insect bite (nonvenomous) of abdominal wall, initial encounter: Secondary | ICD-10-CM | POA: Diagnosis not present

## 2020-08-31 DIAGNOSIS — L918 Other hypertrophic disorders of the skin: Secondary | ICD-10-CM | POA: Diagnosis not present

## 2020-08-31 DIAGNOSIS — D225 Melanocytic nevi of trunk: Secondary | ICD-10-CM | POA: Diagnosis not present

## 2020-10-17 DIAGNOSIS — U071 COVID-19: Secondary | ICD-10-CM | POA: Diagnosis not present

## 2020-10-29 DIAGNOSIS — H25813 Combined forms of age-related cataract, bilateral: Secondary | ICD-10-CM | POA: Diagnosis not present

## 2020-10-30 DIAGNOSIS — E7800 Pure hypercholesterolemia, unspecified: Secondary | ICD-10-CM | POA: Diagnosis not present

## 2020-10-30 DIAGNOSIS — I2510 Atherosclerotic heart disease of native coronary artery without angina pectoris: Secondary | ICD-10-CM | POA: Diagnosis not present

## 2020-10-30 DIAGNOSIS — K219 Gastro-esophageal reflux disease without esophagitis: Secondary | ICD-10-CM | POA: Diagnosis not present

## 2020-10-30 DIAGNOSIS — I10 Essential (primary) hypertension: Secondary | ICD-10-CM | POA: Diagnosis not present

## 2020-10-30 DIAGNOSIS — N401 Enlarged prostate with lower urinary tract symptoms: Secondary | ICD-10-CM | POA: Diagnosis not present

## 2020-11-01 DIAGNOSIS — R351 Nocturia: Secondary | ICD-10-CM | POA: Diagnosis not present

## 2020-11-01 DIAGNOSIS — F419 Anxiety disorder, unspecified: Secondary | ICD-10-CM | POA: Diagnosis not present

## 2020-11-01 DIAGNOSIS — N401 Enlarged prostate with lower urinary tract symptoms: Secondary | ICD-10-CM | POA: Diagnosis not present

## 2020-11-01 DIAGNOSIS — G4709 Other insomnia: Secondary | ICD-10-CM | POA: Diagnosis not present

## 2020-11-14 DIAGNOSIS — F4310 Post-traumatic stress disorder, unspecified: Secondary | ICD-10-CM | POA: Diagnosis not present

## 2020-11-14 DIAGNOSIS — F4324 Adjustment disorder with disturbance of conduct: Secondary | ICD-10-CM | POA: Diagnosis not present

## 2020-11-28 DIAGNOSIS — F4324 Adjustment disorder with disturbance of conduct: Secondary | ICD-10-CM | POA: Diagnosis not present

## 2020-11-28 DIAGNOSIS — F4310 Post-traumatic stress disorder, unspecified: Secondary | ICD-10-CM | POA: Diagnosis not present

## 2020-12-19 DIAGNOSIS — H25813 Combined forms of age-related cataract, bilateral: Secondary | ICD-10-CM | POA: Diagnosis not present

## 2020-12-19 DIAGNOSIS — Z7982 Long term (current) use of aspirin: Secondary | ICD-10-CM | POA: Diagnosis not present

## 2020-12-19 DIAGNOSIS — H25811 Combined forms of age-related cataract, right eye: Secondary | ICD-10-CM | POA: Diagnosis not present

## 2020-12-19 DIAGNOSIS — H52221 Regular astigmatism, right eye: Secondary | ICD-10-CM | POA: Diagnosis not present

## 2020-12-19 DIAGNOSIS — H524 Presbyopia: Secondary | ICD-10-CM | POA: Diagnosis not present

## 2021-01-14 DIAGNOSIS — F4324 Adjustment disorder with disturbance of conduct: Secondary | ICD-10-CM | POA: Diagnosis not present

## 2021-01-14 DIAGNOSIS — F4310 Post-traumatic stress disorder, unspecified: Secondary | ICD-10-CM | POA: Diagnosis not present

## 2021-01-23 DIAGNOSIS — Z7982 Long term (current) use of aspirin: Secondary | ICD-10-CM | POA: Diagnosis not present

## 2021-01-23 DIAGNOSIS — Z961 Presence of intraocular lens: Secondary | ICD-10-CM | POA: Diagnosis not present

## 2021-01-23 DIAGNOSIS — H524 Presbyopia: Secondary | ICD-10-CM | POA: Diagnosis not present

## 2021-01-23 DIAGNOSIS — H52222 Regular astigmatism, left eye: Secondary | ICD-10-CM | POA: Diagnosis not present

## 2021-01-23 DIAGNOSIS — Z9841 Cataract extraction status, right eye: Secondary | ICD-10-CM | POA: Diagnosis not present

## 2021-01-23 DIAGNOSIS — H25812 Combined forms of age-related cataract, left eye: Secondary | ICD-10-CM | POA: Diagnosis not present

## 2021-01-23 DIAGNOSIS — Z955 Presence of coronary angioplasty implant and graft: Secondary | ICD-10-CM | POA: Diagnosis not present

## 2021-01-23 DIAGNOSIS — S0501XA Injury of conjunctiva and corneal abrasion without foreign body, right eye, initial encounter: Secondary | ICD-10-CM | POA: Diagnosis not present

## 2021-02-19 DIAGNOSIS — F419 Anxiety disorder, unspecified: Secondary | ICD-10-CM | POA: Diagnosis not present

## 2021-02-19 DIAGNOSIS — I10 Essential (primary) hypertension: Secondary | ICD-10-CM | POA: Diagnosis not present

## 2021-02-19 DIAGNOSIS — G4709 Other insomnia: Secondary | ICD-10-CM | POA: Diagnosis not present

## 2021-03-12 DIAGNOSIS — I2510 Atherosclerotic heart disease of native coronary artery without angina pectoris: Secondary | ICD-10-CM | POA: Diagnosis not present

## 2021-03-12 DIAGNOSIS — I10 Essential (primary) hypertension: Secondary | ICD-10-CM | POA: Diagnosis not present

## 2021-03-12 DIAGNOSIS — E785 Hyperlipidemia, unspecified: Secondary | ICD-10-CM | POA: Diagnosis not present

## 2021-03-26 DIAGNOSIS — E785 Hyperlipidemia, unspecified: Secondary | ICD-10-CM | POA: Diagnosis not present

## 2021-04-01 DIAGNOSIS — F4310 Post-traumatic stress disorder, unspecified: Secondary | ICD-10-CM | POA: Diagnosis not present

## 2021-04-01 DIAGNOSIS — F4324 Adjustment disorder with disturbance of conduct: Secondary | ICD-10-CM | POA: Diagnosis not present

## 2021-04-12 DIAGNOSIS — L570 Actinic keratosis: Secondary | ICD-10-CM | POA: Diagnosis not present

## 2021-04-12 DIAGNOSIS — D225 Melanocytic nevi of trunk: Secondary | ICD-10-CM | POA: Diagnosis not present

## 2021-04-12 DIAGNOSIS — Z7189 Other specified counseling: Secondary | ICD-10-CM | POA: Diagnosis not present

## 2021-04-12 DIAGNOSIS — L821 Other seborrheic keratosis: Secondary | ICD-10-CM | POA: Diagnosis not present

## 2021-04-12 DIAGNOSIS — D2261 Melanocytic nevi of right upper limb, including shoulder: Secondary | ICD-10-CM | POA: Diagnosis not present

## 2021-04-12 DIAGNOSIS — B351 Tinea unguium: Secondary | ICD-10-CM | POA: Diagnosis not present

## 2021-04-12 DIAGNOSIS — X32XXXA Exposure to sunlight, initial encounter: Secondary | ICD-10-CM | POA: Diagnosis not present

## 2021-04-12 DIAGNOSIS — L918 Other hypertrophic disorders of the skin: Secondary | ICD-10-CM | POA: Diagnosis not present

## 2021-04-29 DIAGNOSIS — F4324 Adjustment disorder with disturbance of conduct: Secondary | ICD-10-CM | POA: Diagnosis not present

## 2021-04-29 DIAGNOSIS — F4310 Post-traumatic stress disorder, unspecified: Secondary | ICD-10-CM | POA: Diagnosis not present

## 2021-06-26 DIAGNOSIS — F4310 Post-traumatic stress disorder, unspecified: Secondary | ICD-10-CM | POA: Diagnosis not present

## 2021-06-26 DIAGNOSIS — F4324 Adjustment disorder with disturbance of conduct: Secondary | ICD-10-CM | POA: Diagnosis not present

## 2021-07-10 DIAGNOSIS — L237 Allergic contact dermatitis due to plants, except food: Secondary | ICD-10-CM | POA: Diagnosis not present

## 2021-07-10 DIAGNOSIS — Z6829 Body mass index (BMI) 29.0-29.9, adult: Secondary | ICD-10-CM | POA: Diagnosis not present

## 2021-07-15 DIAGNOSIS — L239 Allergic contact dermatitis, unspecified cause: Secondary | ICD-10-CM | POA: Diagnosis not present

## 2021-07-15 DIAGNOSIS — Z7189 Other specified counseling: Secondary | ICD-10-CM | POA: Diagnosis not present

## 2021-07-15 DIAGNOSIS — I2510 Atherosclerotic heart disease of native coronary artery without angina pectoris: Secondary | ICD-10-CM | POA: Diagnosis not present

## 2021-07-23 DIAGNOSIS — G4709 Other insomnia: Secondary | ICD-10-CM | POA: Diagnosis not present

## 2021-07-23 DIAGNOSIS — I10 Essential (primary) hypertension: Secondary | ICD-10-CM | POA: Diagnosis not present

## 2021-07-23 DIAGNOSIS — F419 Anxiety disorder, unspecified: Secondary | ICD-10-CM | POA: Diagnosis not present

## 2021-07-23 DIAGNOSIS — I2510 Atherosclerotic heart disease of native coronary artery without angina pectoris: Secondary | ICD-10-CM | POA: Diagnosis not present

## 2021-07-23 DIAGNOSIS — H903 Sensorineural hearing loss, bilateral: Secondary | ICD-10-CM | POA: Diagnosis not present

## 2021-07-24 DIAGNOSIS — H903 Sensorineural hearing loss, bilateral: Secondary | ICD-10-CM | POA: Diagnosis not present

## 2021-07-24 DIAGNOSIS — H6123 Impacted cerumen, bilateral: Secondary | ICD-10-CM | POA: Diagnosis not present

## 2021-07-25 DIAGNOSIS — Z961 Presence of intraocular lens: Secondary | ICD-10-CM | POA: Diagnosis not present

## 2021-07-31 DIAGNOSIS — F4310 Post-traumatic stress disorder, unspecified: Secondary | ICD-10-CM | POA: Diagnosis not present

## 2021-07-31 DIAGNOSIS — F4324 Adjustment disorder with disturbance of conduct: Secondary | ICD-10-CM | POA: Diagnosis not present

## 2021-08-02 DIAGNOSIS — Z23 Encounter for immunization: Secondary | ICD-10-CM | POA: Diagnosis not present

## 2021-10-14 DIAGNOSIS — F4324 Adjustment disorder with disturbance of conduct: Secondary | ICD-10-CM | POA: Diagnosis not present

## 2021-10-14 DIAGNOSIS — F4310 Post-traumatic stress disorder, unspecified: Secondary | ICD-10-CM | POA: Diagnosis not present

## 2021-10-26 DIAGNOSIS — Z23 Encounter for immunization: Secondary | ICD-10-CM | POA: Diagnosis not present

## 2021-10-30 DIAGNOSIS — F4310 Post-traumatic stress disorder, unspecified: Secondary | ICD-10-CM | POA: Diagnosis not present

## 2021-10-30 DIAGNOSIS — F4324 Adjustment disorder with disturbance of conduct: Secondary | ICD-10-CM | POA: Diagnosis not present

## 2021-11-21 DIAGNOSIS — Z23 Encounter for immunization: Secondary | ICD-10-CM | POA: Diagnosis not present

## 2021-12-25 DIAGNOSIS — F4310 Post-traumatic stress disorder, unspecified: Secondary | ICD-10-CM | POA: Diagnosis not present

## 2021-12-25 DIAGNOSIS — F4324 Adjustment disorder with disturbance of conduct: Secondary | ICD-10-CM | POA: Diagnosis not present

## 2022-01-28 DIAGNOSIS — D225 Melanocytic nevi of trunk: Secondary | ICD-10-CM | POA: Diagnosis not present

## 2022-01-28 DIAGNOSIS — Z7189 Other specified counseling: Secondary | ICD-10-CM | POA: Diagnosis not present

## 2022-01-28 DIAGNOSIS — X32XXXA Exposure to sunlight, initial encounter: Secondary | ICD-10-CM | POA: Diagnosis not present

## 2022-01-28 DIAGNOSIS — L821 Other seborrheic keratosis: Secondary | ICD-10-CM | POA: Diagnosis not present

## 2022-01-28 DIAGNOSIS — D2261 Melanocytic nevi of right upper limb, including shoulder: Secondary | ICD-10-CM | POA: Diagnosis not present

## 2022-01-28 DIAGNOSIS — D485 Neoplasm of uncertain behavior of skin: Secondary | ICD-10-CM | POA: Diagnosis not present

## 2022-01-28 DIAGNOSIS — C44719 Basal cell carcinoma of skin of left lower limb, including hip: Secondary | ICD-10-CM | POA: Diagnosis not present

## 2022-01-28 DIAGNOSIS — L570 Actinic keratosis: Secondary | ICD-10-CM | POA: Diagnosis not present

## 2022-07-01 DIAGNOSIS — G4733 Obstructive sleep apnea (adult) (pediatric): Secondary | ICD-10-CM | POA: Insufficient documentation

## 2022-10-14 ENCOUNTER — Ambulatory Visit (INDEPENDENT_AMBULATORY_CARE_PROVIDER_SITE_OTHER): Payer: Medicare Other | Admitting: Internal Medicine

## 2022-10-14 VITALS — BP 128/71 | HR 64 | Resp 16 | Ht 71.0 in | Wt 214.0 lb

## 2022-10-14 DIAGNOSIS — I2510 Atherosclerotic heart disease of native coronary artery without angina pectoris: Secondary | ICD-10-CM | POA: Diagnosis not present

## 2022-10-14 DIAGNOSIS — Z7189 Other specified counseling: Secondary | ICD-10-CM | POA: Diagnosis not present

## 2022-10-14 DIAGNOSIS — I10 Essential (primary) hypertension: Secondary | ICD-10-CM

## 2022-10-14 DIAGNOSIS — G4733 Obstructive sleep apnea (adult) (pediatric): Secondary | ICD-10-CM | POA: Diagnosis not present

## 2022-10-14 NOTE — Patient Instructions (Signed)

## 2022-10-14 NOTE — Progress Notes (Signed)
[facility] [address] [place], [place] [address]  Pulmonary Sleep Medicine   Office Visit Note  Patient Name: Zachary Barron DOB: 1940/10/21 MRN [id number]    Chief Complaint: Obstructive Sleep Apnea visit  Brief History:  Zachary Barron is seen today for an initial consult for CPAP@ 11 cmH2O. The patient has a 4 month history of sleep apnea. Patient is using PAP nightly.  The patient feels rested after sleeping with PAP.  The patient reports benefiting from PAP use. Reported sleepiness is improved and the Epworth Sleepiness Score is 9 out of 24. The patient will take 30 minute naps daily. The patient complains of the following: pt is in need of new supplies due to leakage. Will receive next mail out within the next 2 weeks.  The compliance download shows 80% compliance with an average use time of 5 hours 38 minutes. The AHI is 1.8.  The patient does not complain of limb movements disrupting sleep. The patient continues to require PAP therapy in order to eliminate sleep apnea.   ROS  General: (-) fever, (-) chills, (-) night sweat Nose and Sinuses: (-) nasal stuffiness or itchiness, (-) postnasal drip, (-) nosebleeds, (-) sinus trouble. Mouth and Throat: (-) sore throat, (-) hoarseness. Neck: (-) swollen glands, (-) enlarged thyroid, (-) neck pain. Respiratory: - cough, - shortness of breath, - wheezing. Neurologic: - numbness, - tingling. Psychiatric: - anxiety, - depression   Current Medication: Outpatient Encounter Medications as of 10/14/2022  Medication Sig   acetaminophen (TYLENOL) 500 MG tablet Take 1,000 mg by mouth every 6 (six) hours as needed (For pain.).    amoxicillin (AMOXIL) 500 MG capsule Take 4 capsules by mouth as needed. Take 1 hr before dental appointment   aspirin 81 MG chewable tablet Chew 81 mg by mouth daily.   Cholecalciferol (VITAMIN D) 50 MCG (2000 UT) CAPS Take 1 capsule by mouth daily.   enalapril (VASOTEC) 5 MG tablet Take 1 tablet by mouth  daily.   nitroGLYCERIN (NITROSTAT) 0.4 MG SL tablet Place 1 tablet (0.4 mg total) under the tongue every 5 (five) minutes as needed for chest pain.   sertraline (ZOLOFT) 50 MG tablet Take 25 mg by mouth every other day. At bedtime   simvastatin (ZOCOR) 40 MG tablet Take 1 tablet (40 mg total) by mouth at bedtime. Please keep upcoming appt for future refills. Thank  you   No facility-administered encounter medications on file as of 10/14/2022.    Surgical History: Past Surgical History:  Procedure Laterality Date   bilateral duproyn's contracture surgery      CARDIAC CATHETERIZATION  12/01/2007   CARDIOVASCULAR STRESS TEST  2011   Negative, in [place]   CORONARY ANGIOPLASTY  2009   PCI circumflex and LAD   CORONARY STENT PLACEMENT     FASCIECTOMY Left 02/26/2016   Procedure: Segmental FASCIECTOMY left thumb, index and ring fingers;  Surgeon: Cindee Salt, MD;  Location: [facility];  Service: Orthopedics;  Laterality: Left;   KNEE ARTHROSCOPY Left 07/04/2015   Procedure: ARTHROSCOPY LEFT KNEE WITH MENSICAL DEBRIDEMENT;  Surgeon: Ollen Gross, MD;  Location: [facility];  Service: Orthopedics;  Laterality: Left;   left foot surgery     left rotator cuff repair      PARTIAL KNEE ARTHROPLASTY Left 11/04/2017   Procedure: Left knee medial unicompartmental arthroplasty;  Surgeon: Ollen Gross, MD;  Location: [facility];  Service: Orthopedics;  Laterality: Left;    Medical History: Past Medical History:  Diagnosis Date  Arthritis    Coronary artery disease    Dupuytren contracture    Feet   Fibroma    GERD (gastroesophageal reflux disease)    Hyperlipemia    Hypertension    LBP (low back pain)    MI (myocardial infarction) ([facility]) 2009   acute anterior infarct   PAC (premature atrial contraction)    OCC   Right BBB/left ant fasc block    Stented coronary artery    Tremor    intermittent resting tremor.    Family History: Non contributory to the present illness  Social  History: Social History   Socioeconomic History   Marital status: Married    Spouse name: Not on file   Number of children: Not on file   Years of education: Not on file   Highest education level: Not on file  Occupational History   Not on file  Tobacco Use   Smoking status: Former    Current packs/day: 0.00    Types: Cigarettes    Quit date: 06/11/1975    Years since quitting: 47.3   Smokeless tobacco: Never  Vaping Use   Vaping status: Never Used  Substance and Sexual Activity   Alcohol use: Yes    Comment: occ glass of wine    Drug use: No   Sexual activity: Not on file  Other Topics Concern   Not on file  Social History Narrative   ** Merged History Encounter **       Social Determinants of Health   Financial Resource Strain: Not on file  Food Insecurity: Not on file  Transportation Needs: Not on file  Physical Activity: Not on file  Stress: Not on file  Social Connections: Not on file  Intimate Partner Violence: Not on file    Vital Signs: Blood pressure 128/71, pulse 64, resp. rate 16, height 5\' 11"  (1.803 m), weight 214 lb (97.1 kg), SpO2 96%. Body mass index is 29.85 kg/m.    Examination: General Appearance: The patient is well-developed, well-nourished, and in no distress. Neck Circumference: 43 cm Skin: Gross inspection of skin unremarkable. Head: normocephalic, no gross deformities. Eyes: no gross deformities noted. ENT: ears appear grossly normal Neurologic: Alert and oriented. No involuntary movements.  STOP BANG RISK ASSESSMENT S (snore) Have you been told that you snore?     NO   T (tired) Are you often tired, fatigued, or sleepy during the day?   NO  O (obstruction) Do you stop breathing, choke, or gasp during sleep? NO   P (pressure) Do you have or are you being treated for high blood pressure? YES   B (BMI) Is your body index greater than 35 kg/m? NO   A (age) Are you 81 years old or older? YES   N (neck) Do you have a neck  circumference greater than 16 inches?   YES   G (gender) Are you a male? YES   TOTAL STOP/BANG "YES" ANSWERS 4       A STOP-Bang score of 2 or less is considered low risk, and a score of 5 or more is high risk for having either moderate or severe OSA. For people who score 3 or 4, doctors may need to perform further assessment to determine how likely they are to have OSA.         EPWORTH SLEEPINESS SCALE:  Scale:  (0)= no chance of dozing; (1)= slight chance of dozing; (2)= moderate chance of dozing; (3)= high chance of dozing  Chance  Situtation    Sitting and reading: 1    Watching TV: 3    Sitting Inactive in public: 0    As a passenger in car: 0      Lying down to rest: 3    Sitting and talking: 1    Sitting quielty after lunch: 2    In a car, stopped in traffic: 0   TOTAL SCORE:   9 out of 24    SLEEP STUDIES:  Split Study (06/2022 at [facility]) AHI 16.3/hr, Supine AHI 33/hr, min SpO2 82%, CPAP@ 11 cmH2O   CPAP COMPLIANCE DATA:  Date Range: 08/11/2022-10/09/2022  Average Daily Use: 5 hours 38 minutes  Median Use: 5 hours 58 minutes  Compliance for > 4 Hours: 80%  AHI: 1.8 respiratory events per hour  Days Used: 56/60 days  Mask Leak: 74.2  95th Percentile Pressure: 11         LABS: No results found for this or any previous visit (from the past 2160 hour(s)).  Radiology: No results found.  No results found.  No results found.    Assessment and Plan: Patient Active Problem List   Diagnosis Date Noted   OSA (obstructive sleep apnea) 07/01/2022   OA (osteoarthritis) of knee 11/04/2017   Open wound 03/05/2016   Contracture of palmar fascia 01/14/2016   Pain 01/14/2016   Acute medial meniscal tear 07/04/2015   Dyslipidemia 02/24/2011   History of tobacco abuse 02/24/2011   Hypertension 02/24/2011   CAD (coronary artery disease) 02/24/2011   Coronary atherosclerosis of native coronary artery 06/27/2010   Other and unspecified  hyperlipidemia 06/27/2010      The patient does tolerate PAP and reports benefit from PAP use. The patient was reminded how to adjust mask fit and advised to change supplies regularly. The patient was also counselled on nightly use. The compliance is good. The AHI is 1.8. Patient continues to require PAP to treat their apnea and is medically necessary. Patient does report he is interested in obtaining a travel unit as well.   1. OSA (obstructive sleep apnea) Continue nightly use  2. CPAP use counseling CPAP couseling-Discussed importance of adequate CPAP use as well as proper care and cleaning techniques of machine and all supplies.  3. Primary hypertension Continue current medication and f/u with PCP.  4. Atherosclerosis of native coronary artery of native heart without angina pectoris Followed by cardiology   General Counseling: I have discussed the findings of the evaluation and examination with Zachary Hua.  I have also discussed any further diagnostic evaluation thatmay be needed or ordered today. Zachary Hua verbalizes understanding of the findings of todays visit. We also reviewed his medications today and discussed drug interactions and side effects including but not limited excessive drowsiness and altered mental states. We also discussed that there is always a risk not just to him but also people around him. he has been encouraged to call the office with any questions or concerns that should arise related to todays visit.  No orders of the defined types were placed in this encounter.       I have personally obtained a history, examined the patient, evaluated laboratory and imaging results, formulated the assessment and plan and placed orders.  This patient was seen by Lynn Ito, PA-C in collaboration with Dr. Freda Munro as a part of collaborative care agreement.  Yevonne Pax, MD [facility] Diplomate ABMS Pulmonary Critical Care Medicine and Sleep Medicine

## 2023-06-08 NOTE — Progress Notes (Signed)
 [facility] [address] [place], [place] [address]  Pulmonary Sleep Medicine   Office Visit Note  Patient Name: Zachary Barron DOB: 03/01/1940 MRN [id number]    Chief Complaint: Obstructive Sleep Apnea visit  Brief History:  Zachary Barron is seen today for a follow up visit for CPAP@ 11 cmH2O. The patient has a 11 month history of sleep apnea. Patient is using PAP nightly.  The patient feels rested after sleeping with PAP.  The patient reports benefiting from PAP use. Reported sleepiness is  improved and the Epworth Sleepiness Score is 10 out of 24. The patient will occasionally take naps. The patient complains of the following: none. The compliance download shows 81% compliance with an average use time of 6 hours 2 minutes. The AHI is 0.5.  The patient does not complain of limb movements disrupting sleep. The patient continues to require PAP therapy in order to eliminate sleep apnea.   ROS  General: (-) fever, (-) chills, (-) night sweat Nose and Sinuses: (-) nasal stuffiness or itchiness, (-) postnasal drip, (-) nosebleeds, (-) sinus trouble. Mouth and Throat: (-) sore throat, (-) hoarseness. Neck: (-) swollen glands, (-) enlarged thyroid, (-) neck pain. Respiratory: - cough, - shortness of breath, - wheezing. Neurologic: - numbness, - tingling. Psychiatric: - anxiety, - depression   Current Medication: Outpatient Encounter Medications as of 06/09/2023  Medication Sig   ezetimibe (ZETIA) 10 MG tablet Take 1 tablet by mouth daily.   acetaminophen  (TYLENOL ) 500 MG tablet Take 1,000 mg by mouth every 6 (six) hours as needed (For pain.).    amoxicillin (AMOXIL) 500 MG capsule Take 4 capsules by mouth as needed. Take 1 hr before dental appointment   aspirin 81 MG chewable tablet Chew 81 mg by mouth daily.   Cholecalciferol (VITAMIN D) 50 MCG (2000 UT) CAPS Take 1 capsule by mouth daily.   enalapril  (VASOTEC ) 5 MG tablet Take 1 tablet by mouth daily.   nitroGLYCERIN   (NITROSTAT ) 0.4 MG SL tablet Place 1 tablet (0.4 mg total) under the tongue every 5 (five) minutes as needed for chest pain.   rosuvastatin (CRESTOR) 10 MG tablet Take 10 mg by mouth daily.   sertraline (ZOLOFT) 50 MG tablet Take 25 mg by mouth every other day. At bedtime   [DISCONTINUED] simvastatin  (ZOCOR ) 40 MG tablet Take 1 tablet (40 mg total) by mouth at bedtime. Please keep upcoming appt for future refills. Thank  you   No facility-administered encounter medications on file as of 06/09/2023.    Surgical History: Past Surgical History:  Procedure Laterality Date   bilateral duproyn's contracture surgery      CARDIAC CATHETERIZATION  12/01/2007   CARDIOVASCULAR STRESS TEST  2011   Negative, in [place]   CORONARY ANGIOPLASTY  2009   PCI circumflex and LAD   CORONARY STENT PLACEMENT     FASCIECTOMY Left 02/26/2016   Procedure: Segmental FASCIECTOMY left thumb, index and ring fingers;  Surgeon: Lyanne Sample, MD;  Location: [facility];  Service: Orthopedics;  Laterality: Left;   KNEE ARTHROSCOPY Left 07/04/2015   Procedure: ARTHROSCOPY LEFT KNEE WITH MENSICAL DEBRIDEMENT;  Surgeon: Liliane Rei, MD;  Location: [facility];  Service: Orthopedics;  Laterality: Left;   left foot surgery     left rotator cuff repair      PARTIAL KNEE ARTHROPLASTY Left 11/04/2017   Procedure: Left knee medial unicompartmental arthroplasty;  Surgeon: Liliane Rei, MD;  Location: [facility];  Service: Orthopedics;  Laterality: Left;    Medical  History: Past Medical History:  Diagnosis Date   Arthritis    Coronary artery disease    Dupuytren contracture    Feet   Fibroma    GERD (gastroesophageal reflux disease)    Hyperlipemia    Hypertension    LBP (low back pain)    MI (myocardial infarction) (HCC) 2009   acute anterior infarct   PAC (premature atrial contraction)    OCC   Right BBB/left ant fasc block    Stented coronary artery    Tremor    intermittent resting tremor.    Family  History: Non contributory to the present illness  Social History: Social History   Socioeconomic History   Marital status: Married    Spouse name: Not on file   Number of children: Not on file   Years of education: Not on file   Highest education level: Not on file  Occupational History   Not on file  Tobacco Use   Smoking status: Former    Current packs/day: 0.00    Types: Cigarettes    Quit date: 06/11/1975    Years since quitting: 48.0   Smokeless tobacco: Never  Vaping Use   Vaping status: Never Used  Substance and Sexual Activity   Alcohol use: Yes    Comment: occ glass of wine    Drug use: No   Sexual activity: Not on file  Other Topics Concern   Not on file  Social History Narrative   ** Merged History Encounter **       Social Drivers of Corporate investment banker Strain: Not on file  Food Insecurity: Not on file  Transportation Needs: Not on file  Physical Activity: Not on file  Stress: Not on file  Social Connections: Not on file  Intimate Partner Violence: Not on file    Vital Signs: Blood pressure 122/67, pulse 61, resp. rate 16, height 5\' 11"  (1.803 m), weight 214 lb (97.1 kg), SpO2 96%. Body mass index is 29.85 kg/m.    Examination: General Appearance: The patient is well-developed, well-nourished, and in no distress. Neck Circumference: 45 cm Skin: Gross inspection of skin unremarkable. Head: normocephalic, no gross deformities. Eyes: no gross deformities noted. ENT: ears appear grossly normal Neurologic: Alert and oriented. No involuntary movements.  STOP BANG RISK ASSESSMENT S (snore) Have you been told that you snore?     NO   T (tired) Are you often tired, fatigued, or sleepy during the day?   NO  O (obstruction) Do you stop breathing, choke, or gasp during sleep? NO   P (pressure) Do you have or are you being treated for high blood pressure? YES   B (BMI) Is your body index greater than 35 kg/m? NO   A (age) Are you 82 years  old or older? YES   N (neck) Do you have a neck circumference greater than 16 inches?   YES   G (gender) Are you a male? YES   TOTAL STOP/BANG "YES" ANSWERS 4       A STOP-Bang score of 2 or less is considered low risk, and a score of 5 or more is high risk for having either moderate or severe OSA. For people who score 3 or 4, doctors may need to perform further assessment to determine how likely they are to have OSA.         EPWORTH SLEEPINESS SCALE:  Scale:  (0)= no chance of dozing; (1)= slight chance of dozing; (2)= moderate chance  of dozing; (3)= high chance of dozing  Chance  Situtation    Sitting and reading: 1    Watching TV: 2    Sitting Inactive in public: 0    As a passenger in car: 2      Lying down to rest: 3    Sitting and talking: 1    Sitting quielty after lunch: 1    In a car, stopped in traffic: 0   TOTAL SCORE:   10 out of 24    SLEEP STUDIES:  Split - 06/2022 @ [facility] - AHI 16.3/hr, Supine AHI 33/hr, min Sp02 82%, CPAP @ 11cmH20   CPAP COMPLIANCE DATA:  Date Range: 04/11/2023-06/02/2023  Average Daily Use: 6 hours 2 minutes  Median Use: 6 hours 13 minutes  Compliance for > 4 Hours: 81%  AHI: 0.5 respiratory events per hour  Days Used: 48/53 days  Mask Leak: 51.1  95th Percentile Pressure: 11         LABS: No results found for this or any previous visit (from the past 2160 hours).  Radiology: No results found.  No results found.  No results found.    Assessment and Plan: Patient Active Problem List   Diagnosis Date Noted   OSA (obstructive sleep apnea) 07/01/2022   OA (osteoarthritis) of knee 11/04/2017   Open wound 03/05/2016   Contracture of palmar fascia 01/14/2016   Pain 01/14/2016   Acute medial meniscal tear 07/04/2015   Dyslipidemia 02/24/2011   History of tobacco abuse 02/24/2011   Hypertension 02/24/2011   CAD (coronary artery disease) 02/24/2011   Coronary atherosclerosis of native coronary artery  06/27/2010   Other and unspecified hyperlipidemia 06/27/2010      The patient does tolerate PAP and reports benefit from PAP use. The patient was reminded how to adjust mask fit and advised to change supplies regularly. The patient was also counselled on nightly use. The compliance is good. The AHI is 0.5. Patient continues to require PAP to treat their apnea and is medically necessary. Pt uses Air Mini travel unit, which he actually likes better and uses this during any gaps.  1. OSA (obstructive sleep apnea) (Primary) Continue nightly use  2. CPAP use counseling CPAP couseling-Discussed importance of adequate CPAP use as well as proper care and cleaning techniques of machine and all supplies.  3. Primary hypertension Continue current medication and f/u with PCP.  4. Atherosclerosis of native coronary artery of native heart without angina pectoris Followed by cardiology   General Counseling: I have discussed the findings of the evaluation and examination with Zachary Barron.  I have also discussed any further diagnostic evaluation thatmay be needed or ordered today. Zachary Barron verbalizes understanding of the findings of todays visit. We also reviewed his medications today and discussed drug interactions and side effects including but not limited excessive drowsiness and altered mental states. We also discussed that there is always a risk not just to him but also people around him. he has been encouraged to call the office with any questions or concerns that should arise related to todays visit.  No orders of the defined types were placed in this encounter.       I have personally obtained a history, examined the patient, evaluated laboratory and imaging results, formulated the assessment and plan and placed orders.  This patient was seen by Taylor Favia, PA-C in collaboration with Dr. Cam Cava as a part of collaborative care agreement.  Cordie Deters, MD [facility] Diplomate ABMS Pulmonary  Critical Care Medicine and Sleep Medicine

## 2023-06-09 ENCOUNTER — Ambulatory Visit (INDEPENDENT_AMBULATORY_CARE_PROVIDER_SITE_OTHER): Admitting: Internal Medicine

## 2023-06-09 VITALS — BP 122/67 | HR 61 | Resp 16 | Ht 71.0 in | Wt 214.0 lb

## 2023-06-09 DIAGNOSIS — G4733 Obstructive sleep apnea (adult) (pediatric): Secondary | ICD-10-CM | POA: Diagnosis not present

## 2023-06-09 DIAGNOSIS — I2510 Atherosclerotic heart disease of native coronary artery without angina pectoris: Secondary | ICD-10-CM | POA: Diagnosis not present

## 2023-06-09 DIAGNOSIS — Z7189 Other specified counseling: Secondary | ICD-10-CM

## 2023-06-09 DIAGNOSIS — I10 Essential (primary) hypertension: Secondary | ICD-10-CM

## 2023-06-09 NOTE — Patient Instructions (Signed)
